# Patient Record
Sex: Female | Born: 1965 | Race: White | Hispanic: No | Marital: Married | Smoking: Former smoker
Health system: Southern US, Community
[De-identification: ages and names within clinical notes are randomized; demographics above are authoritative.]

## PROBLEM LIST (undated history)

## (undated) DIAGNOSIS — F959 Tic disorder, unspecified: Secondary | ICD-10-CM

## (undated) DIAGNOSIS — K219 Gastro-esophageal reflux disease without esophagitis: Secondary | ICD-10-CM

## (undated) DIAGNOSIS — J45909 Unspecified asthma, uncomplicated: Secondary | ICD-10-CM

## (undated) DIAGNOSIS — K589 Irritable bowel syndrome without diarrhea: Secondary | ICD-10-CM

---

## 2001-05-16 ENCOUNTER — Other Ambulatory Visit: Admission: RE | Admit: 2001-05-16 | Discharge: 2001-05-16 | Payer: Self-pay | Admitting: Family Medicine

## 2004-11-08 ENCOUNTER — Ambulatory Visit: Payer: Self-pay | Admitting: Family Medicine

## 2005-01-31 ENCOUNTER — Ambulatory Visit: Payer: Self-pay | Admitting: Family Medicine

## 2005-06-16 ENCOUNTER — Ambulatory Visit: Payer: Self-pay | Admitting: Family Medicine

## 2005-09-13 ENCOUNTER — Ambulatory Visit (HOSPITAL_COMMUNITY): Admission: RE | Admit: 2005-09-13 | Discharge: 2005-09-13 | Payer: Self-pay | Admitting: Neurosurgery

## 2010-09-19 ENCOUNTER — Encounter: Payer: Self-pay | Admitting: Neurosurgery

## 2012-07-24 ENCOUNTER — Emergency Department (HOSPITAL_COMMUNITY)
Admission: EM | Admit: 2012-07-24 | Discharge: 2012-07-24 | Disposition: A | Discharge status: Home / Self Care | Payer: BC Managed Care – PPO | Attending: Emergency Medicine | Admitting: Emergency Medicine

## 2012-07-24 ENCOUNTER — Telehealth: Payer: Self-pay | Admitting: Gastroenterology

## 2012-07-24 ENCOUNTER — Emergency Department (HOSPITAL_COMMUNITY): Payer: BC Managed Care – PPO

## 2012-07-24 ENCOUNTER — Encounter (HOSPITAL_COMMUNITY): Payer: Self-pay | Admitting: *Deleted

## 2012-07-24 DIAGNOSIS — Z79899 Other long term (current) drug therapy: Secondary | ICD-10-CM | POA: Insufficient documentation

## 2012-07-24 DIAGNOSIS — Z8719 Personal history of other diseases of the digestive system: Secondary | ICD-10-CM | POA: Insufficient documentation

## 2012-07-24 DIAGNOSIS — K219 Gastro-esophageal reflux disease without esophagitis: Secondary | ICD-10-CM | POA: Insufficient documentation

## 2012-07-24 DIAGNOSIS — R1013 Epigastric pain: Secondary | ICD-10-CM | POA: Insufficient documentation

## 2012-07-24 DIAGNOSIS — R0602 Shortness of breath: Secondary | ICD-10-CM | POA: Insufficient documentation

## 2012-07-24 HISTORY — DX: Irritable bowel syndrome, unspecified: K58.9

## 2012-07-24 HISTORY — DX: Gastro-esophageal reflux disease without esophagitis: K21.9

## 2012-07-24 LAB — COMPREHENSIVE METABOLIC PANEL
ALT: 28 U/L (ref 0–35)
AST: 19 U/L (ref 0–37)
Albumin: 3.4 g/dL — ABNORMAL LOW (ref 3.5–5.2)
Alkaline Phosphatase: 70 U/L (ref 39–117)
BUN: 15 mg/dL (ref 6–23)
Potassium: 4.3 mEq/L (ref 3.5–5.1)
Sodium: 137 mEq/L (ref 135–145)
Total Protein: 6.5 g/dL (ref 6.0–8.3)

## 2012-07-24 LAB — URINALYSIS, MICROSCOPIC ONLY
Bilirubin Urine: NEGATIVE
Nitrite: NEGATIVE
Specific Gravity, Urine: 1.029 (ref 1.005–1.030)
pH: 5.5 (ref 5.0–8.0)

## 2012-07-24 LAB — CBC WITH DIFFERENTIAL/PLATELET
Basophils Absolute: 0 10*3/uL (ref 0.0–0.1)
Basophils Relative: 0 % (ref 0–1)
Eosinophils Absolute: 0.3 10*3/uL (ref 0.0–0.7)
MCH: 31.3 pg (ref 26.0–34.0)
MCHC: 34.4 g/dL (ref 30.0–36.0)
Neutrophils Relative %: 75 % (ref 43–77)
Platelets: 232 10*3/uL (ref 150–400)
RDW: 12.5 % (ref 11.5–15.5)

## 2012-07-24 LAB — LIPASE, BLOOD: Lipase: 23 U/L (ref 11–59)

## 2012-07-24 LAB — POCT PREGNANCY, URINE: Preg Test, Ur: NEGATIVE

## 2012-07-24 MED ORDER — OXYCODONE-ACETAMINOPHEN 5-325 MG PO TABS: 1.0000 | ORAL_TABLET | ORAL | Status: DC | PRN

## 2012-07-24 MED ORDER — GI COCKTAIL ~~LOC~~
30.0000 mL | Freq: Once | ORAL | Status: AC
  Administered 2012-07-24: 30 mL via ORAL
  Filled 2012-07-24: qty 30

## 2012-07-24 MED ORDER — OXYCODONE-ACETAMINOPHEN 5-325 MG PO TABS
1.0000 | ORAL_TABLET | Freq: Once | ORAL | Status: AC
  Administered 2012-07-24: 1 via ORAL
  Filled 2012-07-24: qty 1

## 2012-07-24 MED ORDER — PANTOPRAZOLE SODIUM 40 MG IV SOLR
40.0000 mg | Freq: Once | INTRAVENOUS | Status: AC
  Administered 2012-07-24: 40 mg via INTRAVENOUS
  Filled 2012-07-24: qty 40

## 2012-07-24 MED ORDER — MORPHINE SULFATE 4 MG/ML IJ SOLN
4.0000 mg | Freq: Once | INTRAMUSCULAR | Status: AC
  Administered 2012-07-24: 4 mg via INTRAVENOUS
  Filled 2012-07-24: qty 1

## 2012-07-24 MED ORDER — SUCRALFATE 1 G PO TABS: 1.0000 g | ORAL_TABLET | Freq: Four times a day (QID) | ORAL | Status: DC

## 2012-07-24 MED ORDER — ONDANSETRON HCL 4 MG/2ML IJ SOLN
4.0000 mg | Freq: Once | INTRAMUSCULAR | Status: AC
  Administered 2012-07-24: 4 mg via INTRAVENOUS
  Filled 2012-07-24: qty 2

## 2012-07-24 NOTE — ED Provider Notes (Signed)
Medical screening examination/treatment/procedure(s) were performed by non-physician practitioner and as supervising physician I was immediately available for consultation/collaboration.  Cheri Guppy, MD 07/24/12 [phone]

## 2012-07-24 NOTE — ED Notes (Signed)
Report given to Robyn, RN.

## 2012-07-24 NOTE — ED Notes (Signed)
Pt has had n/v x3 days; pt states 4 episodes of emesis; pt also reports abdominal pain that has been present for some time and has progressively gotten worse; pt rates pain 7/10; pt is tender to palpate; pt also has a hx of IBS and reports loose stools today; pt seen at [facility] this am and treated for IBS and gastritis; abdominal pain still persists;

## 2012-07-24 NOTE — ED Notes (Signed)
Pt c/o generalized abdominal pain with n/v since Saturday.  Denies urinary sx

## 2012-07-24 NOTE — Telephone Encounter (Signed)
Pt has been to the ER as well as Urgent care she was told she has gastritis and IBS she was seen as a child by a GI but no history since.  Scheduled with Willette Cluster 07/25/12 930 am

## 2012-07-24 NOTE — ED Provider Notes (Signed)
History     CSN: [id number]  Arrival date & time 07/24/12  0203   First MD Initiated Contact with Patient 07/24/12 [phone]      Chief Complaint  Patient presents with  . Abdominal Pain    (Consider location/radiation/quality/duration/timing/severity/associated sxs/prior treatment) HPI History provided by pt.   Pt has chronic, intermittent, post-prandial epigastric pain for which she takes prevacid.  Pain acutely worsened 1.5 weeks ago and has gradually increased in severity since then.  Describes as non-exertional, non-positional, burning w/ radiation into chest and associated N/V.  Has not had fever, cough, worse than baseline diarrhea, hematemesis/hematochezia/melena, GU sx.  Has had exertional SOB for the past 6 weeks.  Was seen by an urgent care for this problem yesterday and she was discharged home w/ recommendation to double up on her prevacid and f/u with GI.  Woke w/ intolerable pain this morning.   Past Medical History  Diagnosis Date  . GERD (gastroesophageal reflux disease)   . IBS (irritable bowel syndrome)     Past Surgical History  Procedure Date  . Cesarean section     History reviewed. No pertinent family history.  History  Substance Use Topics  . Smoking status: Never Smoker   . Smokeless tobacco: Not on file  . Alcohol Use: No    OB History    Grav Para Term Preterm Abortions TAB SAB Ect Mult Living                  Review of Systems  All other systems reviewed and are negative.    Allergies  Review of patient's allergies indicates no known allergies.  Home Medications   Current Outpatient Rx  Name  Route  Sig  Dispense  Refill  . EMETROL 1.87-1.87-21.5 PO SOLN   Oral   Take 10 mLs by mouth every 15 (fifteen) minutes as needed. For nausea         . LANSOPRAZOLE 30 MG PO CPDR   Oral   Take 30 mg by mouth daily.           BP 124/79  Pulse 91  Temp 97.7 F (36.5 C) (Oral)  Resp 19  SpO2 98%  Physical Exam  Nursing note and  vitals reviewed. Constitutional: She is oriented to person, place, and time. She appears well-developed and well-nourished. No distress.  HENT:  Head: Normocephalic and atraumatic.  Eyes:       Normal appearance  Neck: Normal range of motion.  Cardiovascular: Normal rate and regular rhythm.   Pulmonary/Chest: Effort normal and breath sounds normal. No respiratory distress.       Mid-line chest tenderness  Abdominal: Soft. Bowel sounds are normal. She exhibits no distension and no mass. There is no rebound and no guarding.       Epigastric and RUQ tto  Genitourinary:       No CVA tenderness  Musculoskeletal: Normal range of motion.  Neurological: She is alert and oriented to person, place, and time.  Skin: Skin is warm and dry. No rash noted.  Psychiatric: She has a normal mood and affect. Her behavior is normal.    ED Course  Procedures (including critical care time)   Date: 07/24/2012  Rate: 88  Rhythm: normal sinus rhythm  QRS Axis: normal  Intervals: normal  ST/T Wave abnormalities: normal  Conduction Disutrbances:none  Narrative Interpretation:   Old EKG Reviewed: none available   Labs Reviewed  COMPREHENSIVE METABOLIC PANEL - Abnormal; Notable for the following:  Glucose, Bld 134 (*)     Albumin 3.4 (*)     Total Bilirubin 0.2 (*)     GFR calc non Af Amer 81 (*)     All other components within normal limits  URINALYSIS, MICROSCOPIC ONLY - Abnormal; Notable for the following:    APPearance CLOUDY (*)     Leukocytes, UA SMALL (*)     Bacteria, UA MANY (*)     Squamous Epithelial / LPF MANY (*)     All other components within normal limits  CBC WITH DIFFERENTIAL  LIPASE, BLOOD  POCT PREGNANCY, URINE  URINE CULTURE  URINALYSIS, ROUTINE W REFLEX MICROSCOPIC   Dg Chest 2 View  07/24/2012  *RADIOLOGY REPORT*  Clinical Data: Abdominal pain and shortness of breath.  CHEST - 2 VIEW  Comparison: None.  Findings: The lungs are well-aerated and clear.  There is no  evidence of focal opacification, pleural effusion or pneumothorax.  The heart is normal in size; the mediastinal contour is within normal limits.  No acute osseous abnormalities are seen.  IMPRESSION: No acute cardiopulmonary process seen.   Original Report Authenticated By: Tonia Ghent, M.D.    US Abdomen Complete  07/24/2012  *RADIOLOGY REPORT*  Clinical Data:  Epigastric abdominal pain.  ABDOMINAL ULTRASOUND COMPLETE  Comparison:  None  Findings:  Gallbladder:  The gallbladder is normal in appearance, without evidence for gallstones, gallbladder wall thickening or pericholecystic fluid.  No ultrasonographic Murphy's sign is elicited.  Common Bile Duct:  0.3 cm in diameter; within normal limits in caliber.  Liver:  Normal parenchymal echogenicity and echotexture; scattered small foci of increased echogenicity within the left hepatic lobe, measuring up to 2.4 cm in size, most likely reflect hemangiomas, though correlation with laboratory values is suggested.  Limited Doppler evaluation demonstrates normal blood flow within the liver.  IVC:  Unremarkable in appearance.  Pancreas:  Although the pancreas is difficult to visualize in its entirety due to overlying bowel gas, no focal pancreatic abnormality is identified.  Spleen:  8.7 cm in length; within normal limits in size and echotexture.  Right kidney:  11.2 cm in length; normal in size, configuration and parenchymal echogenicity.  No evidence of mass or hydronephrosis.  Left kidney:  10.4 cm in length; normal in size, configuration and parenchymal echogenicity.  No evidence of mass or hydronephrosis.  Abdominal Aorta:  Normal in caliber; no aneurysm identified.  Evaluation is mildly suboptimal due to overlying bowel gas.  IMPRESSION:  1.  Gallbladder unremarkable in appearance. 2.  Scattered foci of increased echogenicity within the left hepatic lobe, measuring up to 2.4 cm in size, most likely reflect hemangiomas, though correlation with LFTs is suggested  as deemed clinically appropriate.   Original Report Authenticated By: Tonia Ghent, M.D.      1. Epigastric pain       MDM  [phone] F w/ GERD and IBS, otherwise healthy, presents w/ acute on chronic, severe epigastric pain + N/V.  ROS revealed 6wks exertional SOB as well.  TIMI 0.  Labs unremarkable.  EKG non-ischemic and CXR neg.  [place] abd ordered to r/o cholelithiasis, particularly because of duration of sx, colicky nature and lack of improvement w/ PPI.  Pt to receive IV NS, protonix and morphine as well as GI cocktail.    Pt reports that her pain and nausea have resolved.  US unremarkable.   Results discussed w/ pt.   Sx likely d/t gastritis.  D/c'd home w/ percocet and carafate and recommended that  she continue her prevacid.  She has a prescription for phenergan.  Referred to gastroenterology as well as cardiology.  Return precautions discussed. 5:24 AM         Otilio Miu, PA 07/24/12 [id number]  Otilio Miu, PA 07/24/12 [phone]

## 2012-07-25 ENCOUNTER — Encounter: Payer: Self-pay | Admitting: Physician Assistant

## 2012-07-25 ENCOUNTER — Ambulatory Visit (INDEPENDENT_AMBULATORY_CARE_PROVIDER_SITE_OTHER)
Admission: RE | Admit: 2012-07-25 | Discharge: 2012-07-25 | Disposition: A | Discharge status: Home / Self Care | Payer: BC Managed Care – PPO | Source: Ambulatory Visit | Attending: Physician Assistant | Admitting: Physician Assistant

## 2012-07-25 ENCOUNTER — Telehealth: Payer: Self-pay | Admitting: *Deleted

## 2012-07-25 ENCOUNTER — Ambulatory Visit (INDEPENDENT_AMBULATORY_CARE_PROVIDER_SITE_OTHER): Payer: BC Managed Care – PPO | Admitting: Physician Assistant

## 2012-07-25 VITALS — BP 124/82 | HR 109 | Ht 67.0 in | Wt 199.6 lb

## 2012-07-25 DIAGNOSIS — R1033 Periumbilical pain: Secondary | ICD-10-CM

## 2012-07-25 DIAGNOSIS — R52 Pain, unspecified: Secondary | ICD-10-CM

## 2012-07-25 DIAGNOSIS — R112 Nausea with vomiting, unspecified: Secondary | ICD-10-CM

## 2012-07-25 LAB — URINE CULTURE: Colony Count: 9000

## 2012-07-25 MED ORDER — DICYCLOMINE HCL 10 MG PO CAPS: ORAL_CAPSULE | ORAL | Status: DC

## 2012-07-25 MED ORDER — IOHEXOL 300 MG/ML  SOLN
100.0000 mL | Freq: Once | INTRAMUSCULAR | Status: AC | PRN
  Administered 2012-07-25: 100 mL via INTRAVENOUS

## 2012-07-25 NOTE — Patient Instructions (Addendum)
We sent a prescription for Bentyl ( Dicyclomine) to [facility].   You have been scheduled for a CT scan of the abdomen and pelvis at [facility] ([address]---this is in the same building as Architectural technologist).   You are scheduled today at 2:30 PM . You should arrive at 2:15 PM  to your appointment time for registration. Please follow the written instructions below on the day of your exam:  WARNING: IF YOU ARE ALLERGIC TO IODINE/X-RAY DYE, PLEASE NOTIFY RADIOLOGY IMMEDIATELY AT [phone]! YOU WILL BE GIVEN A 13 HOUR PREMEDICATION PREP.  1) Do not eat or drink anything after 10:30 am  (4 hours prior to your test) 2) You have been given 2 bottles of oral contrast to drink. The solution may taste better if refrigerated, but do NOT add ice or any other liquid to this solution. Shake  well before drinking.    Drink 1 bottle of contrast @ 12:30 PM  (2 hours prior to your exam)  Drink 1 bottle of contrast @ 1:30 PM  (1 hour prior to your exam)  You may take any medications as prescribed with a small amount of water except for the following: Metformin, Glucophage, Glucovance, Avandamet, Riomet, Fortamet, Actoplus Met, Janumet, Glumetza or Metaglip. The above medications must be held the day of the exam AND 48 hours after the exam.  The purpose of you drinking the oral contrast is to aid in the visualization of your intestinal tract. The contrast solution may cause some diarrhea. Before your exam is started, you will be given a small amount of fluid to drink. Depending on your individual set of symptoms, you may also receive an intravenous injection of x-ray contrast/dye. Plan on being at [facility] for 30 minutes or long, depending on the type of exam you are having performed.  If you have any questions regarding your exam or if you need to reschedule, you may call the CT department at [phone] between the hours of 8:00 am and 5:00 pm,  Monday-Friday.  ________________________________________________________________________

## 2012-07-25 NOTE — Telephone Encounter (Signed)
Called and informed the patient's husband that Amy said we won't have results until Monday AM for them due to the holiday long weekend.  We are not here the next 2 days, Thursday or Friday.  I told him to advise his wife to take the Bentyl at the first signs of cramping.  I also said Amy wasn't real concerned . She ordered the CT to rule out IBD.  He thanked me for calling.

## 2012-07-25 NOTE — Progress Notes (Signed)
Agree with initial assessment and plans. Amy, please followup on the CT scan and have the patient followup with you in a few weeks. Depending upon the findings and response to therapies, we can decide on further evaluation such as endoscopic procedures.

## 2012-07-25 NOTE — Progress Notes (Signed)
Subjective:    Patient ID: [name], female    DOB: 01/23/1966, 46 y.o.   MRN: [id number]  HPI Alvis Lemmings is a pleasant 46 year old white female, new to GI today referred by the ER. She had presented to the emergency room on 07/24/2012 with complaints of epigastric pain which had become unbearable. She had labs done with negative urine pregnancy test negative urine culture, normal CBC, and normal C. met with the exception of an albumin at 3.4. She had upper abdominal ultrasound done which was negative with the exception of some small foci of increased echogenicity within the left lobe of the liver measuring up to 2.4 cm in size most likely reflecting hemangiomas. Patient relates late long history of chronic GERD and has been on PPI therapy on a daily basis for many years. She states that she stopped her medicine she will have recurrent symptoms fairly quickly. She has no complaints of dysphagia or heartburn. She says she has history of probable IBS and has had periodic episodes of abdominal discomfort and loose stools but over the past 2 months has had 3 distinct episodes of mid abdominal pain cramping nausea intermittent vomiting and mild diarrhea. She says usually these episodes in the past of only lasted a day or 2 and these past 3 episodes have been more prolonged and the last one especially more intense. Her last episode started on Saturday 1123 and says she had sharp constant ongoing pain for about 4 days. She did have chills associated with this and some sweats but no documented fever. She says her bowel movements are usually loose but just one bowel movement a day, she had some increase in diarrhea past couple of days which has since resolved. She does not use any regular aspirin or NSAIDs., No recent antibiotics. She has weaned herself off of Lexapro Wellbutrin over a 2 month. Says the symptoms were all ready occurring when she had done that. Her appetite is good between these episodes and she's not lost  any weight    Review of Systems  Constitutional: Positive for appetite change.  HENT: Negative.   Eyes: Negative.   Respiratory: Negative.   Cardiovascular: Negative.   Gastrointestinal: Positive for nausea and abdominal pain.  Genitourinary: Negative.   Neurological: Negative.   Hematological: Negative.   Psychiatric/Behavioral: Negative.    Outpatient Prescriptions Prior to Visit  Medication Sig Dispense Refill  . anti-nausea (EMETROL) solution Take 10 mLs by mouth every 15 (fifteen) minutes as needed. For nausea      . lansoprazole (PREVACID) 30 MG capsule Take 30 mg by mouth daily.      Marland Kitchen oxyCODONE-acetaminophen (PERCOCET/ROXICET) 5-325 MG per tablet Take 1 tablet by mouth every 4 (four) hours as needed for pain.  20 tablet  0  . sucralfate (CARAFATE) 1 G tablet Take 1 tablet (1 g total) by mouth 4 (four) times daily.  30 tablet  0  . gi cocktail (Maalox,Lidocaine,Donnatal)       . morphine 4 MG/ML injection 4 mg       . ondansetron (ZOFRAN) injection 4 mg       . oxyCODONE-acetaminophen (PERCOCET/ROXICET) 5-325 MG per tablet 1 tablet       . pantoprazole (PROTONIX) injection 40 mg        There is no problem list on file for this patient.  History  Substance Use Topics  . Smoking status: Never Smoker   . Smokeless tobacco: Never Used  . Alcohol Use: No   Family History  Problem Relation Age of Onset  . Prostate cancer Father   . Diabetes Mother   . Diabetes Father   . Stomach cancer Maternal Grandmother   . Liver cancer Paternal Grandfather   . Heart disease Father       Objective:   Physical Exam well-developed white female in no acute distress, company by her husband. Blood pressure 124/82 pulse 109 height 5 foot 7 weight 199. HEENT; nontraumatic normocephalic EOMI PERRLA sclera anicteric,Neck; Supple no JVD, Cardiovascular; regular rate and rhythm with S1-S2 no murmur or gallop, Pulmonary; clear bilaterally, Abdomen; soft bowel sounds are active there is no  palpable mass or hepatosplenomegaly, she is very tender in the right mid quadrant and.periumbilically,there is no guarding or rebound   Rectal; exam not done, Extremities; no clubbing cyanosis or edema skin warm and dry, Psych; mood and affect normal and appropriate.        Assessment & Plan:  #54 46 year old female with chronic GERD #2  3 episodes of mid abdominal pain and cramping associated with nausea occasional vomiting and loose stool occurring over the past 2 months. Last episode more prolonged and intense. No evidence for biliary colic by recent ultrasound . 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

## 2012-10-13 ENCOUNTER — Other Ambulatory Visit: Payer: Self-pay

## 2013-07-04 ENCOUNTER — Other Ambulatory Visit: Payer: Self-pay

## 2015-10-22 ENCOUNTER — Institutional Professional Consult (permissible substitution): Payer: Self-pay | Admitting: Pulmonary Disease

## 2015-11-11 ENCOUNTER — Encounter: Payer: Self-pay | Admitting: Pulmonary Disease

## 2015-11-11 ENCOUNTER — Ambulatory Visit (INDEPENDENT_AMBULATORY_CARE_PROVIDER_SITE_OTHER): Payer: Managed Care, Other (non HMO) | Admitting: Pulmonary Disease

## 2015-11-11 VITALS — BP 124/66 | HR 88 | Ht 67.0 in | Wt 227.2 lb

## 2015-11-11 DIAGNOSIS — J4551 Severe persistent asthma with (acute) exacerbation: Secondary | ICD-10-CM | POA: Diagnosis not present

## 2015-11-11 MED ORDER — FLUTICASONE FUROATE 100 MCG/ACT IN AEPB
1.0000 | INHALATION_SPRAY | Freq: Every day | RESPIRATORY_TRACT | Status: DC
Start: 2015-11-11 — End: 2017-01-04

## 2015-11-11 NOTE — Patient Instructions (Signed)
We restart you on the Arnuity Elipta. Please use it once a day. Continue using the albuterol rescue inhaler as needed. I'll schedule you for pulmonary function tests and a sleep study.

## 2015-11-11 NOTE — Progress Notes (Signed)
Subjective:    Patient ID: Elizabeth Krueger, female    DOB: 10/05/1965, 50 y.o.   MRN: [id number]  HPI Consult for management of asthma.  Mrs. Elizabeth Krueger is a 50 year old with past medical history of asthma, hypertension, allergy. She's been diagnosed with asthma in 2014. She reports feeling unwell for the past few months. Symptoms include dyspnea, wheezing, chest congestion, cough. There is no sputum production, fevers, chills. She had been treated with doxycycline and prednisone by her primary care physician. She was put on Arnuity Elipta for a week last month. She reports improvement in her symptoms at that time. She is no longer on it and is just using the albuterol rescue inhaler.  She has seasonal allergies and is sensitive to pollen and dust. She also has acid reflux and is on a PPI. She reports daily snoring, daytime fatigue and witnessed apneas.  Social History: Minimal remote smoking history. She smoked 1 pack per week for a couple of years. She quit around 2000. Occasional alcohol use no drug use. 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 There are no exposures at work or at home.  Family History: Father-asthma, COPD, heart disease, rheumatism.   Past Medical History  Diagnosis Date  . GERD (gastroesophageal reflux disease)   . IBS (irritable bowel syndrome)     Current outpatient prescriptions:  .  albuterol (PROAIR HFA) 108 (90 Base) MCG/ACT inhaler, Inhale 2 puffs into the lungs every 6 (six) hours as needed for wheezing or shortness of breath., Disp: , Rfl:  .  albuterol (PROVENTIL) (2.5 MG/3ML) 0.083% nebulizer solution, Take 2.5 mg by nebulization every 6 (six) hours as needed for wheezing or shortness of breath., Disp: , Rfl:  .  ALPRAZolam (XANAX) 0.5 MG tablet, Take 0.5 mg by mouth at bedtime as needed., Disp: , Rfl:  .  dicyclomine (BENTYL) 10 MG capsule, Take 1 tab 2-3 times daily as needed for cramping and spasms., Disp: 90 capsule, Rfl: 0 .   escitalopram (LEXAPRO) 10 MG tablet, Take 10 mg by mouth daily., Disp: , Rfl:  .  hydrochlorothiazide (HYDRODIURIL) 25 MG tablet, Take 25 mg by mouth daily., Disp: , Rfl:  .  lansoprazole (PREVACID) 30 MG capsule, Take 30 mg by mouth daily., Disp: , Rfl:  .  metoprolol succinate (TOPROL-XL) 100 MG 24 hr tablet, Take 100 mg by mouth daily. Take with or immediately following a meal., Disp: , Rfl:  .  zolpidem (AMBIEN) 5 MG tablet, Take 2.5 mg by mouth at bedtime as needed for sleep., Disp: , Rfl:   Review of Systems Cough, dyspnea, wheezing, congestion. No sputum production, fevers, chills. No chest pain, palpitation. No nausea, vomiting, diarrhea, constipation. No fevers, chills. All other review of systems are negative    Objective:   Physical Exam Blood pressure 124/66, pulse 88, height 5\' 7"  (1.702 m), weight 227 lb 3.2 oz (103.057 kg), SpO2 97 %. Gen: No apparent distress Neuro: No gross focal deficits. Neck: No JVD, lymphadenopathy, thyromegaly. RS: B/L wheeze. No crackles CVS: S1-S2 heard, no murmurs rubs gallops. Abdomen: Soft, positive bowel sounds. Extremities: No edema.    Assessment & Plan:  #1 Asthmatic bronchitis She has severe persistent asthma with with daily symptoms. She improved previously on inhaled glucocorticoid. I will resume this and it and she'll continue the albuterol rescue inhaler. I'll reassess her symptoms in a month and decide if she needs to be on a LABA as well. She'll get scheduled for pulmonary function tests. I  will get CBC with diff and IgE level at next visit.  #2 Apnea She'll get scheduled for a split-night sleep study to evaluate for OSA.  Plan: - PFTs - Resume Arnuity Elipta - Continue albuterol rescue inhaler - Sleep study  Chilton Greathouse MD [facility] Pager [phone] If no answer or after 3pm call: [phone] 11/11/2015, 11:10 AM

## 2015-11-13 NOTE — Addendum Note (Signed)
Addended by: Boone MasterJONES, JESSICA E on: 11/13/2015 04:17 PM   Modules accepted: Orders, Medications

## 2015-11-16 ENCOUNTER — Telehealth: Payer: Self-pay | Admitting: *Deleted

## 2015-11-16 NOTE — Telephone Encounter (Signed)
Submitted PA for Arnuity thru [facility]. Key: Geoffery [id number] Submitted for review  Lamonte SakaiCigna ID:[id number]  [facility] [phone] (Phone) [phone] (Fax)  Will await response

## 2015-11-17 NOTE — Telephone Encounter (Signed)
PA is still in process 

## 2015-11-19 NOTE — Telephone Encounter (Signed)
Arnuity has been denied. Public affairs consultantnsurance states Qvar or Pulmicort Flexihaler.

## 2015-11-20 NOTE — Telephone Encounter (Signed)
LMTCB

## 2015-11-20 NOTE — Telephone Encounter (Signed)
Change arnuity prescription to Qvar 160 mcg twice daily

## 2015-11-26 MED ORDER — BECLOMETHASONE DIPROPIONATE 80 MCG/ACT IN AERS: 2.0000 | INHALATION_SPRAY | Freq: Two times a day (BID) | RESPIRATORY_TRACT | Status: DC

## 2015-11-26 NOTE — Telephone Encounter (Signed)
lmtcb x2 for pt. 

## 2015-11-26 NOTE — Telephone Encounter (Signed)
Pt returned call. Informed her of PM's recs and verified pharmacy as [facility] [place]. She voiced understanding and had no further questions. Rx sent. Nothing further needed.

## 2015-12-07 ENCOUNTER — Institutional Professional Consult (permissible substitution): Payer: Self-pay | Admitting: Pulmonary Disease

## 2015-12-09 ENCOUNTER — Institutional Professional Consult (permissible substitution): Payer: Self-pay | Admitting: Pulmonary Disease

## 2015-12-15 ENCOUNTER — Telehealth: Payer: Self-pay | Admitting: Pulmonary Disease

## 2015-12-15 NOTE — Telephone Encounter (Signed)
Spoke with Almyra FreeLibby about this as she takes care of precert's on these items. Almyra FreeLibby has information for patient and aware that phone message is being sent to her.

## 2015-12-15 NOTE — Telephone Encounter (Signed)
Pt's [employer] does required a precert and forms have been filled out and faxed to [employer] Sally E Ottinger

## 2015-12-18 ENCOUNTER — Encounter: Payer: Self-pay | Admitting: Pulmonary Disease

## 2015-12-18 ENCOUNTER — Other Ambulatory Visit (INDEPENDENT_AMBULATORY_CARE_PROVIDER_SITE_OTHER): Payer: Managed Care, Other (non HMO)

## 2015-12-18 ENCOUNTER — Ambulatory Visit (HOSPITAL_COMMUNITY)
Admission: RE | Admit: 2015-12-18 | Discharge: 2015-12-18 | Disposition: A | Discharge status: Home / Self Care | Payer: Managed Care, Other (non HMO) | Source: Ambulatory Visit | Attending: Pulmonary Disease | Admitting: Pulmonary Disease

## 2015-12-18 ENCOUNTER — Ambulatory Visit (INDEPENDENT_AMBULATORY_CARE_PROVIDER_SITE_OTHER): Payer: Managed Care, Other (non HMO) | Admitting: Pulmonary Disease

## 2015-12-18 VITALS — BP 118/84 | HR 82 | Ht 67.0 in | Wt 232.4 lb

## 2015-12-18 DIAGNOSIS — J4551 Severe persistent asthma with (acute) exacerbation: Secondary | ICD-10-CM | POA: Insufficient documentation

## 2015-12-18 DIAGNOSIS — J4550 Severe persistent asthma, uncomplicated: Secondary | ICD-10-CM | POA: Diagnosis not present

## 2015-12-18 LAB — PULMONARY FUNCTION TEST
DL/VA % PRED: 84 %
DL/VA: 4.33 ml/min/mmHg/L
DLCO UNC: 19.95 ml/min/mmHg
DLCO unc % pred: 70 %
FEF 25-75 POST: 3.05 L/s
FEF 25-75 Pre: 2.32 L/sec
FEF2575-%Change-Post: 31 %
FEF2575-%PRED-POST: 103 %
FEF2575-%PRED-PRE: 78 %
FEV1-%CHANGE-POST: 6 %
FEV1-%Pred-Post: 86 %
FEV1-%Pred-Pre: 81 %
FEV1-Post: 2.69 L
FEV1-Pre: 2.53 L
FEV1FVC-%Change-Post: 3 %
FEV1FVC-%PRED-PRE: 98 %
FEV6-%Change-Post: 3 %
FEV6-%Pred-Post: 86 %
FEV6-%Pred-Pre: 83 %
FEV6-POST: 3.29 L
FEV6-Pre: 3.18 L
FEV6FVC-%CHANGE-POST: 0 %
FEV6FVC-%PRED-POST: 102 %
FEV6FVC-%Pred-Pre: 102 %
FVC-%Change-Post: 3 %
FVC-%PRED-POST: 84 %
FVC-%PRED-PRE: 81 %
FVC-POST: 3.31 L
FVC-PRE: 3.2 L
POST FEV1/FVC RATIO: 81 %
PRE FEV1/FVC RATIO: 79 %
Post FEV6/FVC ratio: 99 %
Pre FEV6/FVC Ratio: 99 %
RV % pred: 86 %
RV: 1.66 L
TLC % PRED: 87 %
TLC: 4.83 L

## 2015-12-18 LAB — CBC WITH DIFFERENTIAL/PLATELET
BASOS ABS: 0 10*3/uL (ref 0.0–0.1)
Basophils Relative: 0.4 % (ref 0.0–3.0)
EOS PCT: 6.2 % — AB (ref 0.0–5.0)
Eosinophils Absolute: 0.4 10*3/uL (ref 0.0–0.7)
HEMATOCRIT: 40.9 % (ref 36.0–46.0)
Hemoglobin: 13.7 g/dL (ref 12.0–15.0)
LYMPHS ABS: 2.7 10*3/uL (ref 0.7–4.0)
LYMPHS PCT: 38 % (ref 12.0–46.0)
MCHC: 33.5 g/dL (ref 30.0–36.0)
MCV: 93.2 fl (ref 78.0–100.0)
MONOS PCT: 5.4 % (ref 3.0–12.0)
Monocytes Absolute: 0.4 10*3/uL (ref 0.1–1.0)
NEUTROS ABS: 3.6 10*3/uL (ref 1.4–7.7)
Neutrophils Relative %: 50 % (ref 43.0–77.0)
Platelets: 244 10*3/uL (ref 150.0–400.0)
RBC: 4.39 Mil/uL (ref 3.87–5.11)
RDW: 13.1 % (ref 11.5–15.5)
WBC: 7.1 10*3/uL (ref 4.0–10.5)

## 2015-12-18 MED ORDER — ALBUTEROL SULFATE (2.5 MG/3ML) 0.083% IN NEBU
2.5000 mg | INHALATION_SOLUTION | Freq: Once | RESPIRATORY_TRACT | Status: AC
  Administered 2015-12-18: 2.5 mg via RESPIRATORY_TRACT

## 2015-12-18 NOTE — Patient Instructions (Addendum)
Continue using the arnuity samples that you have. When this is over we'll switch over to Qvar as per your insurance company. We will send for CBC with diff and IgE levels today.  Return to clinic in 6 months.

## 2015-12-18 NOTE — Progress Notes (Signed)
Subjective:    Patient ID: Elizabeth Krueger, female    DOB: 11-Nov-1965, 50 y.o.   MRN: [id number]  PROBLEM LIST: Severe persistent asthma  HPI Elizabeth Krueger is a 50 year old with past medical history of asthma, hypertension, allergy. She's been diagnosed with asthma in 2014. She reports feeling unwell for the past few months. Symptoms include dyspnea, wheezing, chest congestion, cough. There is no sputum production, fevers, chills. She had been treated with doxycycline and prednisone by her primary care physician. She has seasonal allergies and is sensitive to pollen and dust. She also has acid reflux and is on a PPI. She reports daily snoring, daytime fatigue and witnessed apneas.  She was started on arnuity at the last visit and is doing very well on it. She hardly ever uses to use the albuterol inhaler.   DATA: PFTs 12/18/15 FVC 2.20 (81%] FEV1 2.53 (81%] F/F 79 TLC 87% DLCO 70% Minimal obstructive disease as shown by scooping out off her expiratory limb. Mild diffusion defect which corrects for alveolar volume   Social History: Minimal remote smoking history. She smoked 1 pack per week for a couple of years. She quit around 2000. Occasional alcohol use no drug use. 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 There are no exposures at work or at home.  Family History: Father-asthma, COPD, heart disease, rheumatism.   Past Medical History  Diagnosis Date  . GERD (gastroesophageal reflux disease)   . IBS (irritable bowel syndrome)     Current outpatient prescriptions:  .  albuterol (PROAIR HFA) 108 (90 Base) MCG/ACT inhaler, Inhale 2 puffs into the lungs every 6 (six) hours as needed for wheezing or shortness of breath., Disp: , Rfl:  .  albuterol (PROVENTIL) (2.5 MG/3ML) 0.083% nebulizer solution, Take 2.5 mg by nebulization every 6 (six) hours as needed for wheezing or shortness of breath., Disp: , Rfl:  .  citalopram (CELEXA) 20 MG tablet, Take 20 mg by mouth  daily., Disp: , Rfl:  .  Fluticasone Furoate (ARNUITY ELLIPTA) 100 MCG/ACT AEPB, Inhale 1 puff into the lungs daily., Disp: 30 each, Rfl: 3 .  hydrochlorothiazide (HYDRODIURIL) 25 MG tablet, Take 25 mg by mouth daily., Disp: , Rfl:  .  lansoprazole (PREVACID) 30 MG capsule, Take 30 mg by mouth daily., Disp: , Rfl:  .  metoprolol succinate (TOPROL-XL) 100 MG 24 hr tablet, Take 100 mg by mouth daily. Take with or immediately following a meal., Disp: , Rfl:  .  zolpidem (AMBIEN) 5 MG tablet, Take 2.5 mg by mouth at bedtime as needed for sleep., Disp: , Rfl:  .  ALPRAZolam (XANAX) 0.5 MG tablet, Take 0.5 mg by mouth at bedtime as needed. Reported on 12/18/2015, Disp: , Rfl:  .  beclomethasone (QVAR) 80 MCG/ACT inhaler, Inhale 2 puffs into the lungs 2 (two) times daily. (Patient not taking: Reported on 12/18/2015), Disp: 1 Inhaler, Rfl: 6  Review of Systems No Cough, dyspnea, wheezing, congestion. No sputum production, fevers, chills. No chest pain, palpitation. No nausea, vomiting, diarrhea, constipation. No fevers, chills. All other review of systems are negative    Objective:   Physical Exam Blood pressure 118/84, pulse 82, height  (1.702 m), weight 232 lb 6.4 oz (105.416 kg), SpO2 95 %. Gen: No apparent distress Neuro: No gross focal deficits. Neck: No JVD, lymphadenopathy, thyromegaly. RS: B/L wheeze. No crackles CVS: S1-S2 heard, no murmurs rubs gallops. Abdomen: Soft, positive bowel sounds. Extremities: No edema.    Assessment & Plan:  #  1 Asthmatic bronchitis Her symptoms have improved considerably on arnuity, She hardly needs to use the albuterol inhaler at all. She still has some samples of these left and when they are over she will switch to Qvar as per her insurance coverage. She will call us if her symptom control gets worse with this change.  I will get CBC with diff and IgE level today as a baseline assessment.  #2 Apnea She is scheduled for a split-night sleep study to  evaluate for OSA in a few weeks  Plan: - Switch arnuity to qvar. - Continue albuterol rescue inhaler - Sleep study - CBC with diff, IgE levels.  Chilton GreathousePraveen Jashay Roddy MD [facility] Pager [phone] If no answer or after 3pm call: [phone] 12/18/2015, 12:00 PM

## 2015-12-21 LAB — IGE: IgE (Immunoglobulin E), Serum: 206 kU/L — ABNORMAL HIGH (ref <115)

## 2015-12-24 ENCOUNTER — Telehealth: Payer: Self-pay | Admitting: Pulmonary Disease

## 2015-12-24 NOTE — Telephone Encounter (Signed)
Please let the patient know that the blood tests show elevation in eosinophils and IgE. This is consistent with asthma. I do not recommend any change to her current therapy Spoke with pt and notified of results per Dr. Jarold MottoManamm. Pt verbalized understanding and denied any questions.

## 2015-12-24 NOTE — Progress Notes (Signed)
Quick Note:  Spoke with pt and notified of results per Dr. Manamm. Pt verbalized understanding and denied any questions.  ______ 

## 2015-12-25 ENCOUNTER — Telehealth: Payer: Self-pay | Admitting: *Deleted

## 2015-12-25 DIAGNOSIS — G4733 Obstructive sleep apnea (adult) (pediatric): Secondary | ICD-10-CM

## 2015-12-25 NOTE — Telephone Encounter (Signed)
HST okayed per PM per Almyra FreeLibby Order placed Will sign off

## 2015-12-25 NOTE — Telephone Encounter (Signed)
-----   Message from Tobe SosSally E Ottinger sent at 12/22/2015  2:18 PM EDT ----- [employer] denied this split night study i need an order for a home study thanks libby

## 2016-01-01 ENCOUNTER — Encounter (HOSPITAL_BASED_OUTPATIENT_CLINIC_OR_DEPARTMENT_OTHER): Payer: Managed Care, Other (non HMO)

## 2016-01-13 ENCOUNTER — Telehealth: Payer: Self-pay | Admitting: Pulmonary Disease

## 2016-01-13 NOTE — Telephone Encounter (Signed)
Called spoke with pt. Pt states that she has a sinus infection at this time. She is scheduled for the HST tomorrow but feels that it will be extremely uncomfortable due to the infection. She is requesting that we cancel tomorrow's HST and reschedule. I explained to her that [facility] would [facility] and that she would receive a call to reschedule. She voiced understanding and had no further questions.

## 2016-01-14 NOTE — Telephone Encounter (Signed)
I spoke to pt & rescheduled her HST.  Nothing further needed.

## 2016-01-29 DIAGNOSIS — G4733 Obstructive sleep apnea (adult) (pediatric): Secondary | ICD-10-CM | POA: Diagnosis not present

## 2016-02-04 DIAGNOSIS — G4733 Obstructive sleep apnea (adult) (pediatric): Secondary | ICD-10-CM | POA: Diagnosis not present

## 2016-02-05 ENCOUNTER — Telehealth: Payer: Self-pay | Admitting: Pulmonary Disease

## 2016-02-05 ENCOUNTER — Other Ambulatory Visit: Payer: Self-pay | Admitting: *Deleted

## 2016-02-05 DIAGNOSIS — G4733 Obstructive sleep apnea (adult) (pediatric): Secondary | ICD-10-CM

## 2016-02-05 NOTE — Telephone Encounter (Signed)
Attempted to call. Line was busy. Will try back.

## 2016-02-08 NOTE — Telephone Encounter (Signed)
[phone], pt cb

## 2016-02-09 NOTE — Telephone Encounter (Addendum)
Spoke with the pt  She states HST done 01/29/16  She is requesting results  Please advise, thanks!

## 2016-02-09 NOTE — Telephone Encounter (Signed)
Pt returning call for results.Elizabeth Krueger ° °

## 2016-02-09 NOTE — Telephone Encounter (Signed)
LMTCB

## 2016-02-09 NOTE — Telephone Encounter (Signed)
[phone], pt cb

## 2016-02-09 NOTE — Progress Notes (Signed)
Quick Note:  LMOM TCB x1. ______ 

## 2016-02-09 NOTE — Telephone Encounter (Signed)
Please let the patient know that the sleep studies confirms sleep apnea. Please order auto CPAP 5-15 with mask fitting.

## 2016-02-09 NOTE — Telephone Encounter (Signed)
Spoke with patient-she is aware that she has OSA and needs CPAP therapy; order placed and aware that our PCC's will contact with name of company. Also, will send message to JJ to work out OV when needed for follow up CPAP set up.

## 2016-02-10 NOTE — Telephone Encounter (Signed)
Dr Isaiah SergeMannam, please advise when you would like pt to follow up for CPAP.  Thank you.

## 2016-02-11 NOTE — Telephone Encounter (Signed)
Spoke with the pt and notified per PM needs 3 month f/u  Appt scheduled for 05/13/16

## 2016-02-11 NOTE — Telephone Encounter (Signed)
Follow up in 3 months with CPAP download.

## 2016-02-12 ENCOUNTER — Telehealth: Payer: Self-pay | Admitting: Pulmonary Disease

## 2016-02-12 NOTE — Telephone Encounter (Signed)
Ordered cancelled with [facility] and [facility] Ottinger

## 2016-02-12 NOTE — Telephone Encounter (Signed)
Order was placed and patient was set up with [facility] for her CPAP machine.  Pt has not received anything from the DME yet and it [facility]. [facility] in [place] close to her house and she wants to use them instead of [facility] if they are in her network. Please advise PCC if this is able to be switched. Thanks. (please reply to triage)

## 2016-02-24 ENCOUNTER — Telehealth: Payer: Self-pay | Admitting: Pulmonary Disease

## 2016-02-24 NOTE — Telephone Encounter (Signed)
Spoke with pt and she states that both Qvar and Arnuity are very expensive on her insurance. They are going toward her deductible and she cannot afford. She reports that since starting CPAP therapy her breathing has improved greatly. She is wondering if she can try to stop maintenance inhalers and only use albuterol for rescue as needed. Pt states that Arnuity made her mouth burn and food taste bad. If she needs to continue she would like to use Qvar and she will need samples.   PM Please advise. Thanks!

## 2016-02-24 NOTE — Telephone Encounter (Signed)
Spoke with pt and advised that she would need to contact insurance co to determine which inhaler would be equivalent to Qvar at a cheaper cost. Explained to pt that we are not able to know which inhaler costs how much. Pt voiced understanding and states that she will contact ins co and call us back. Nothing further needed at this time.

## 2016-02-25 NOTE — Telephone Encounter (Signed)
OK to just use albuterol as needed. We will reassess her symptom control at next visit.

## 2016-02-25 NOTE — Telephone Encounter (Signed)
PM please advise 

## 2016-02-25 NOTE — Telephone Encounter (Signed)
Called spoke with pt and is aware of recs. Nothing further needed 

## 2016-05-13 ENCOUNTER — Ambulatory Visit: Payer: Managed Care, Other (non HMO) | Admitting: Pulmonary Disease

## 2016-05-16 ENCOUNTER — Other Ambulatory Visit: Payer: Self-pay | Admitting: Physician Assistant

## 2016-09-08 ENCOUNTER — Other Ambulatory Visit: Payer: Self-pay | Admitting: Physician Assistant

## 2016-09-09 NOTE — Telephone Encounter (Signed)
Rx called in to pharmacy. 

## 2016-09-19 ENCOUNTER — Other Ambulatory Visit: Payer: Self-pay | Admitting: Physician Assistant

## 2016-09-23 ENCOUNTER — Ambulatory Visit: Payer: Self-pay | Admitting: Physician Assistant

## 2016-10-11 ENCOUNTER — Encounter: Payer: Self-pay | Admitting: Physician Assistant

## 2016-11-07 ENCOUNTER — Other Ambulatory Visit: Payer: Self-pay | Admitting: Physician Assistant

## 2016-11-21 ENCOUNTER — Other Ambulatory Visit: Payer: Self-pay | Admitting: Physician Assistant

## 2016-12-23 ENCOUNTER — Other Ambulatory Visit: Payer: Self-pay | Admitting: Physician Assistant

## 2016-12-26 ENCOUNTER — Telehealth: Payer: Self-pay | Admitting: Physician Assistant

## 2016-12-26 MED ORDER — METOPROLOL SUCCINATE ER 100 MG PO TB24: 100.0000 mg | ORAL_TABLET | Freq: Every day | ORAL | 0 refills | Status: DC

## 2016-12-26 NOTE — Telephone Encounter (Signed)
Patient has not been seen here yet. May refill the medication one time but must has appointment.

## 2016-12-26 NOTE — Telephone Encounter (Signed)
What is the name of the medication?metoprol  Have you contacted your pharmacy to request a refill? yes  Which pharmacy would you like this sent to?the drug [facility] in [place]   Patient notified that their request is being sent to the clinical staff for review and that they should receive a call once it is complete. If they do not receive a call within 24 hours they can check with their pharmacy or our office.

## 2016-12-26 NOTE — Telephone Encounter (Signed)
Patient aware and rx sent to pharmacy.  

## 2017-01-04 ENCOUNTER — Encounter: Payer: Self-pay | Admitting: Physician Assistant

## 2017-01-04 ENCOUNTER — Ambulatory Visit (INDEPENDENT_AMBULATORY_CARE_PROVIDER_SITE_OTHER): Payer: Managed Care, Other (non HMO) | Admitting: Physician Assistant

## 2017-01-04 VITALS — BP 111/74 | HR 76 | Temp 99.1°F | Ht 67.0 in | Wt 239.0 lb

## 2017-01-04 DIAGNOSIS — I10 Essential (primary) hypertension: Secondary | ICD-10-CM

## 2017-01-04 DIAGNOSIS — G4730 Sleep apnea, unspecified: Secondary | ICD-10-CM

## 2017-01-04 DIAGNOSIS — J4520 Mild intermittent asthma, uncomplicated: Secondary | ICD-10-CM

## 2017-01-04 DIAGNOSIS — F419 Anxiety disorder, unspecified: Secondary | ICD-10-CM

## 2017-01-04 DIAGNOSIS — R251 Tremor, unspecified: Secondary | ICD-10-CM

## 2017-01-04 DIAGNOSIS — K219 Gastro-esophageal reflux disease without esophagitis: Secondary | ICD-10-CM

## 2017-01-04 MED ORDER — LANSOPRAZOLE 30 MG PO CPDR: 30.0000 mg | DELAYED_RELEASE_CAPSULE | Freq: Two times a day (BID) | ORAL | 1 refills | Status: DC

## 2017-01-04 MED ORDER — CITALOPRAM HYDROBROMIDE 40 MG PO TABS: ORAL_TABLET | ORAL | 3 refills | Status: DC

## 2017-01-04 MED ORDER — METOPROLOL SUCCINATE ER 100 MG PO TB24: 100.0000 mg | ORAL_TABLET | Freq: Every day | ORAL | 3 refills | Status: DC

## 2017-01-04 MED ORDER — HYDROCHLOROTHIAZIDE 25 MG PO TABS: 25.0000 mg | ORAL_TABLET | Freq: Every day | ORAL | 0 refills | Status: DC

## 2017-01-04 MED ORDER — MONTELUKAST SODIUM 10 MG PO TABS: 10.0000 mg | ORAL_TABLET | Freq: Every evening | ORAL | 3 refills | Status: DC

## 2017-01-04 NOTE — Patient Instructions (Signed)
Food Choices for Gastroesophageal Reflux Disease, Adult When you have gastroesophageal reflux disease (GERD), the foods you eat and your eating habits are very important. Choosing the right foods can help ease your discomfort. What guidelines do I need to follow?  Choose fruits, vegetables, whole grains, and low-fat dairy products.  Choose low-fat meat, fish, and poultry.  Limit fats such as oils, salad dressings, butter, nuts, and avocado.  Keep a food diary. This helps you identify foods that cause symptoms.  Avoid foods that cause symptoms. These may be different for everyone.  Eat small meals often instead of 3 large meals a day.  Eat your meals slowly, in a place where you are relaxed.  Limit fried foods.  Cook foods using methods other than frying.  Avoid drinking alcohol.  Avoid drinking large amounts of liquids with your meals.  Avoid bending over or lying down until 2-3 hours after eating. What foods are not recommended? These are some foods and drinks that may make your symptoms worse: Vegetables  Tomatoes. Tomato juice. Tomato and spaghetti sauce. Chili peppers. Onion and garlic. Horseradish. Fruits  Oranges, grapefruit, and lemon (fruit and juice). Meats  High-fat meats, fish, and poultry. This includes hot dogs, ribs, ham, sausage, salami, and bacon. Dairy  Whole milk and chocolate milk. Sour cream. Cream. Butter. Ice cream. Cream cheese. Drinks  Coffee and tea. Bubbly (carbonated) drinks or energy drinks. Condiments  Hot sauce. Barbecue sauce. Sweets/Desserts  Chocolate and cocoa. Donuts. Peppermint and spearmint. Fats and Oils  High-fat foods. This includes French fries and potato chips. Other  Vinegar. Strong spices. This includes black pepper, white pepper, red pepper, cayenne, curry powder, cloves, ginger, and chili powder. The items listed above may not be a complete list of foods and drinks to avoid. Contact your dietitian for more information.    This information is not intended to replace advice given to you by your health care provider. Make sure you discuss any questions you have with your health care provider. Document Released: 02/14/2012 Document Revised: 01/21/2016 Document Reviewed: 06/19/2013 Elsevier Interactive Patient Education  2017 Elsevier Inc.  

## 2017-01-06 DIAGNOSIS — I10 Essential (primary) hypertension: Secondary | ICD-10-CM | POA: Insufficient documentation

## 2017-01-06 DIAGNOSIS — F419 Anxiety disorder, unspecified: Secondary | ICD-10-CM | POA: Insufficient documentation

## 2017-01-06 DIAGNOSIS — J4520 Mild intermittent asthma, uncomplicated: Secondary | ICD-10-CM | POA: Insufficient documentation

## 2017-01-06 DIAGNOSIS — G4730 Sleep apnea, unspecified: Secondary | ICD-10-CM | POA: Insufficient documentation

## 2017-01-06 DIAGNOSIS — K219 Gastro-esophageal reflux disease without esophagitis: Secondary | ICD-10-CM | POA: Insufficient documentation

## 2017-01-06 DIAGNOSIS — R251 Tremor, unspecified: Secondary | ICD-10-CM | POA: Insufficient documentation

## 2017-01-06 NOTE — Progress Notes (Signed)
BP 111/74   Pulse 76   Temp 99.1 F (37.3 C) (Oral)   Ht 5\' 7"  (1.702 m)   Wt 239 lb (108.4 kg)   BMI 37.43 kg/m    Subjective:    Patient ID: Elizabeth Krueger, female    DOB: 1966/04/03, 51 y.o.   MRN: [id number]  HPI: Elizabeth Krueger is a 51 y.o. female presenting on 01/04/2017 for New Patient (Initial Visit) (pt here today for medication refills)  This patient comes in for periodic recheck on medications and conditions including Hypertension, depression, allergy, asthma, GERD. Patient has also had increased tremor and slight twitching in head and neck. At the end of her speaking she has a very slight whistle. It happens after she is talking in his excitedly talking. She has not been controlling her GERD very well. She is ready for a gastro-evaluation. She has had some dysphagia and food getting caught. She tries to eat as small as she can.   All medications are reviewed today. There are no reports of any problems with the medications. All of the medical conditions are reviewed and updated.  Lab work is reviewed and will be ordered as medically necessary. There are no new problems reported with today's visit.   Relevant past medical, surgical, family and social history reviewed and updated as indicated. Allergies and medications reviewed and updated.  Past Medical History:  Diagnosis Date  . GERD (gastroesophageal reflux disease)   . IBS (irritable bowel syndrome)     Past Surgical History:  Procedure Laterality Date  . CESAREAN SECTION      Review of Systems  Constitutional: Positive for fatigue and unexpected weight change. Negative for activity change and fever.  HENT: Negative.   Eyes: Negative.   Respiratory: Positive for shortness of breath. Negative for cough, wheezing and stridor.   Cardiovascular: Negative.  Negative for chest pain, palpitations and leg swelling.  Gastrointestinal: Positive for abdominal distention and abdominal pain.  Endocrine: Negative.   Genitourinary:  Negative.  Negative for dysuria.  Musculoskeletal: Negative.   Skin: Negative.  Negative for color change and pallor.  Neurological: Negative.   Psychiatric/Behavioral: The patient is nervous/anxious.     Allergies as of 01/04/2017   No Known Allergies     Medication List       Accurate as of 01/04/17 11:59 PM. Always use your most recent med list.          albuterol (2.5 MG/3ML) 0.083% nebulizer solution Commonly known as:  PROVENTIL Take 2.5 mg by nebulization every 6 (six) hours as needed for wheezing or shortness of breath.   PROAIR HFA 108 (90 Base) MCG/ACT inhaler Generic drug:  albuterol Inhale 2 puffs into the lungs every 6 (six) hours as needed for wheezing or shortness of breath.   ALPRAZolam 0.5 MG tablet Commonly known as:  XANAX Take 1 tablet (0.5 mg total) by mouth 2 (two) times daily.   citalopram 40 MG tablet Commonly known as:  CELEXA TAKE ONE (1) TABLET EACH DAY   hydrochlorothiazide 25 MG tablet Commonly known as:  HYDRODIURIL Take 1 tablet (25 mg total) by mouth daily.   lansoprazole 30 MG capsule Commonly known as:  PREVACID Take 1 capsule (30 mg total) by mouth 2 (two) times daily.   metoprolol succinate 100 MG 24 hr tablet Commonly known as:  TOPROL-XL Take 1 tablet (100 mg total) by mouth daily. Take with or immediately following a meal.   montelukast 10 MG tablet Commonly known as:  SINGULAIR Take 1 tablet (10 mg total) by mouth every evening.   zolpidem 5 MG tablet Commonly known as:  AMBIEN Take 2.5 mg by mouth at bedtime as needed for sleep.          Objective:    BP 111/74   Pulse 76   Temp 99.1 F (37.3 C) (Oral)   Ht 5\' 7"  (1.702 m)   Wt 239 lb (108.4 kg)   BMI 37.43 kg/m   No Known Allergies  Physical Exam  Constitutional: She is oriented to person, place, and time. She appears well-developed and well-nourished.  HENT:  Head: Normocephalic and atraumatic.  Right Ear: Tympanic membrane, external ear and ear canal  normal.  Left Ear: Tympanic membrane, external ear and ear canal normal.  Nose: Nose normal. No rhinorrhea.  Mouth/Throat: Oropharynx is clear and moist and mucous membranes are normal. No oropharyngeal exudate or posterior oropharyngeal erythema.  Eyes: Conjunctivae and EOM are normal. Pupils are equal, round, and reactive to light.  Neck: Normal range of motion. Neck supple.  Cardiovascular: Normal rate, regular rhythm, normal heart sounds and intact distal pulses.   Pulmonary/Chest: Effort normal and breath sounds normal.  Abdominal: Soft. Bowel sounds are normal.  Neurological: She is alert and oriented to person, place, and time. She has normal reflexes. She displays tremor.  Slight twitch of head and neck vessels. There he slight whistle after the end of speaking  Skin: Skin is warm and dry. No rash noted.  Psychiatric: She has a normal mood and affect. Her behavior is normal. Judgment and thought content normal.        Assessment & Plan:   1. Gastroesophageal reflux disease without esophagitis - lansoprazole (PREVACID) 30 MG capsule; Take 1 capsule (30 mg total) by mouth 2 (two) times daily.  Dispense: 180 capsule; Refill: 1  2. Tremor Control GERD, area of tremor is in voice and slight end phrase whistle, worse when talking quickly or excitedly  3. Anxiety - citalopram (CELEXA) 40 MG tablet; TAKE ONE (1) TABLET EACH DAY  Dispense: 90 tablet; Refill: 3  4. Mild intermittent asthma, unspecified whether complicated - montelukast (SINGULAIR) 10 MG tablet; Take 1 tablet (10 mg total) by mouth every evening.  Dispense: 90 tablet; Refill: 3  5. Essential hypertension - metoprolol succinate (TOPROL-XL) 100 MG 24 hr tablet; Take 1 tablet (100 mg total) by mouth daily. Take with or immediately following a meal.  Dispense: 90 tablet; Refill: 3 - hydrochlorothiazide (HYDRODIURIL) 25 MG tablet; Take 1 tablet (25 mg total) by mouth daily.  Dispense: 90 tablet; Refill: 0  6. Sleep apnea,  unspecified type Follow recommendation of pulmonology   Current Outpatient Prescriptions:  .  albuterol (PROAIR HFA) 108 (90 Base) MCG/ACT inhaler, Inhale 2 puffs into the lungs every 6 (six) hours as needed for wheezing or shortness of breath., Disp: , Rfl:  .  albuterol (PROVENTIL) (2.5 MG/3ML) 0.083% nebulizer solution, Take 2.5 mg by nebulization every 6 (six) hours as needed for wheezing or shortness of breath., Disp: , Rfl:  .  ALPRAZolam (XANAX) 0.5 MG tablet, Take 1 tablet (0.5 mg total) by mouth 2 (two) times daily., Disp: 60 tablet, Rfl: 2 .  citalopram (CELEXA) 40 MG tablet, TAKE ONE (1) TABLET EACH DAY, Disp: 90 tablet, Rfl: 3 .  hydrochlorothiazide (HYDRODIURIL) 25 MG tablet, Take 1 tablet (25 mg total) by mouth daily., Disp: 90 tablet, Rfl: 0 .  lansoprazole (PREVACID) 30 MG capsule, Take 1 capsule (30 mg total) by  mouth 2 (two) times daily., Disp: 180 capsule, Rfl: 1 .  metoprolol succinate (TOPROL-XL) 100 MG 24 hr tablet, Take 1 tablet (100 mg total) by mouth daily. Take with or immediately following a meal., Disp: 90 tablet, Rfl: 3 .  montelukast (SINGULAIR) 10 MG tablet, Take 1 tablet (10 mg total) by mouth every evening., Disp: 90 tablet, Rfl: 3 .  zolpidem (AMBIEN) 5 MG tablet, Take 2.5 mg by mouth at bedtime as needed for sleep., Disp: , Rfl:   Continue all other maintenance medications as listed above.  Follow up plan: Return in about 6 months (around 07/07/2017) for recheck.  Educational handout given for GERD  Remus Loffler PA-C [facility] [address]  [place], [place] [address] [phone]   01/06/2017, 2:09 PM

## 2017-03-08 ENCOUNTER — Encounter: Payer: Self-pay | Admitting: Physician Assistant

## 2017-04-24 ENCOUNTER — Other Ambulatory Visit: Payer: Self-pay | Admitting: Physician Assistant

## 2017-04-25 NOTE — Telephone Encounter (Signed)
Called into Drug Store

## 2017-06-01 ENCOUNTER — Telehealth: Payer: Self-pay | Admitting: Physician Assistant

## 2017-06-06 ENCOUNTER — Other Ambulatory Visit: Payer: Self-pay | Admitting: Physician Assistant

## 2017-06-06 DIAGNOSIS — K219 Gastro-esophageal reflux disease without esophagitis: Secondary | ICD-10-CM

## 2017-07-25 ENCOUNTER — Other Ambulatory Visit: Payer: Self-pay | Admitting: Physician Assistant

## 2017-07-25 DIAGNOSIS — K219 Gastro-esophageal reflux disease without esophagitis: Secondary | ICD-10-CM

## 2017-08-02 ENCOUNTER — Emergency Department (HOSPITAL_COMMUNITY): Payer: Self-pay

## 2017-08-02 ENCOUNTER — Encounter (HOSPITAL_COMMUNITY): Payer: Self-pay | Admitting: Emergency Medicine

## 2017-08-02 ENCOUNTER — Other Ambulatory Visit: Payer: Self-pay

## 2017-08-02 ENCOUNTER — Observation Stay (HOSPITAL_COMMUNITY)
Admission: EM | Admit: 2017-08-02 | Discharge: 2017-08-03 | Disposition: A | Discharge status: Home / Self Care | Payer: Self-pay | Attending: Internal Medicine | Admitting: Internal Medicine

## 2017-08-02 DIAGNOSIS — R197 Diarrhea, unspecified: Secondary | ICD-10-CM

## 2017-08-02 DIAGNOSIS — Z87891 Personal history of nicotine dependence: Secondary | ICD-10-CM | POA: Insufficient documentation

## 2017-08-02 DIAGNOSIS — K219 Gastro-esophageal reflux disease without esophagitis: Secondary | ICD-10-CM | POA: Diagnosis present

## 2017-08-02 DIAGNOSIS — F419 Anxiety disorder, unspecified: Secondary | ICD-10-CM | POA: Diagnosis present

## 2017-08-02 DIAGNOSIS — J4520 Mild intermittent asthma, uncomplicated: Secondary | ICD-10-CM | POA: Diagnosis present

## 2017-08-02 DIAGNOSIS — I10 Essential (primary) hypertension: Secondary | ICD-10-CM | POA: Diagnosis present

## 2017-08-02 DIAGNOSIS — G4730 Sleep apnea, unspecified: Secondary | ICD-10-CM | POA: Diagnosis present

## 2017-08-02 DIAGNOSIS — R1010 Upper abdominal pain, unspecified: Secondary | ICD-10-CM

## 2017-08-02 DIAGNOSIS — R112 Nausea with vomiting, unspecified: Principal | ICD-10-CM

## 2017-08-02 DIAGNOSIS — Z79899 Other long term (current) drug therapy: Secondary | ICD-10-CM | POA: Insufficient documentation

## 2017-08-02 HISTORY — DX: Tic disorder, unspecified: F95.9

## 2017-08-02 LAB — URINALYSIS, ROUTINE W REFLEX MICROSCOPIC
Bilirubin Urine: NEGATIVE
GLUCOSE, UA: NEGATIVE mg/dL
Hgb urine dipstick: NEGATIVE
Ketones, ur: NEGATIVE mg/dL
Nitrite: NEGATIVE
PH: 5 (ref 5.0–8.0)
Protein, ur: NEGATIVE mg/dL
Specific Gravity, Urine: 1.017 (ref 1.005–1.030)

## 2017-08-02 LAB — COMPREHENSIVE METABOLIC PANEL
ALK PHOS: 78 U/L (ref 38–126)
ALT: 45 U/L (ref 14–54)
AST: 27 U/L (ref 15–41)
Albumin: 3.7 g/dL (ref 3.5–5.0)
Anion gap: 8 (ref 5–15)
BUN: 16 mg/dL (ref 6–20)
CALCIUM: 8.6 mg/dL — AB (ref 8.9–10.3)
CO2: 25 mmol/L (ref 22–32)
CREATININE: 1.04 mg/dL — AB (ref 0.44–1.00)
Chloride: 106 mmol/L (ref 101–111)
GFR calc non Af Amer: >60 mL/min (ref >60)
Glucose, Bld: 104 mg/dL — ABNORMAL HIGH (ref 65–99)
Potassium: 4.2 mmol/L (ref 3.5–5.1)
SODIUM: 139 mmol/L (ref 135–145)
Total Bilirubin: 0.5 mg/dL (ref 0.3–1.2)
Total Protein: 7.1 g/dL (ref 6.5–8.1)

## 2017-08-02 LAB — LIPASE, BLOOD: Lipase: 34 U/L (ref 11–51)

## 2017-08-02 LAB — CBC
HCT: 42.4 % (ref 36.0–46.0)
Hemoglobin: 13.8 g/dL (ref 12.0–15.0)
MCH: 30.5 pg (ref 26.0–34.0)
MCHC: 32.5 g/dL (ref 30.0–36.0)
MCV: 93.6 fL (ref 78.0–100.0)
PLATELETS: 248 10*3/uL (ref 150–400)
RBC: 4.53 MIL/uL (ref 3.87–5.11)
RDW: 12.8 % (ref 11.5–15.5)
WBC: 12.6 10*3/uL — ABNORMAL HIGH (ref 4.0–10.5)

## 2017-08-02 LAB — POC URINE PREG, ED: Preg Test, Ur: NEGATIVE

## 2017-08-02 LAB — TSH: TSH: 1.325 u[IU]/mL (ref 0.350–4.500)

## 2017-08-02 MED ORDER — PROMETHAZINE HCL 25 MG/ML IJ SOLN
12.5000 mg | Freq: Once | INTRAMUSCULAR | Status: AC
  Administered 2017-08-02: 12.5 mg via INTRAVENOUS
  Filled 2017-08-02: qty 1

## 2017-08-02 MED ORDER — SODIUM CHLORIDE 0.9 % IV BOLUS (SEPSIS)
1000.0000 mL | Freq: Once | INTRAVENOUS | Status: AC
  Administered 2017-08-02: 1000 mL via INTRAVENOUS

## 2017-08-02 MED ORDER — ONDANSETRON HCL 4 MG/2ML IJ SOLN
4.0000 mg | Freq: Once | INTRAMUSCULAR | Status: AC
  Administered 2017-08-02: 4 mg via INTRAVENOUS
  Filled 2017-08-02: qty 2

## 2017-08-02 NOTE — ED Notes (Signed)
Pt states the Phenergan helped her nausea and pain. Pt sleeping at this time.

## 2017-08-02 NOTE — ED Provider Notes (Signed)
[facility]ENCY [facility]MENT Provider Note   CSN: [id number] Arrival date & time: 08/02/17  1659     History   Chief Complaint Chief Complaint  Patient presents with  . Emesis    HPI Elizabeth LevansMelissa Koop is a 51 y.o. female.  HPI Patient presents with nausea vomiting diarrhea.  She has been dealing with this for the last couple years.  Comes and goes.  Is been diagnosed with irritable bowel in the past.  Has decreased appetite.  Some dull abdominal pain.  Began on Friday with today being Wednesday.  Had one day of relief on Sunday but otherwise been feeling bad.  Decreased appetite.  Has had some loose stools and has had some dark green stools.  Only abdominal surgeries been a cesarean section.  Has been under some stress at work.  Has history of sleep apnea but is not using her CPAP. Past Medical History:  Diagnosis Date  . GERD (gastroesophageal reflux disease)   . IBS (irritable bowel syndrome)     Patient Active Problem List   Diagnosis Date Noted  . Gastroesophageal reflux disease without esophagitis 01/06/2017  . Tremor 01/06/2017  . Anxiety 01/06/2017  . Mild intermittent asthma 01/06/2017  . Essential hypertension 01/06/2017  . Sleep apnea 01/06/2017    Past Surgical History:  Procedure Laterality Date  . CESAREAN SECTION      OB History    No data available       Home Medications    Prior to Admission medications   Medication Sig Start Date End Date Taking? Authorizing Provider  albuterol (PROAIR HFA) 108 (90 Base) MCG/ACT inhaler Inhale 2 puffs into the lungs every 6 (six) hours as needed for wheezing or shortness of breath.   Yes [provider]  albuterol (PROVENTIL) (2.5 MG/3ML) 0.083% nebulizer solution Take 2.5 mg by nebulization every 6 (six) hours as needed for wheezing or shortness of breath.   Yes [provider]  citalopram (CELEXA) 40 MG tablet TAKE ONE (1) TABLET EACH DAY 01/04/17  Yes Remus LofflerJones, Angel S, PA-C  hydrochlorothiazide  (HYDRODIURIL) 25 MG tablet Take 1 tablet (25 mg total) by mouth daily. Patient taking differently: Take 25 mg by mouth daily as needed (FOR FLUID).  01/04/17  Yes Remus LofflerJones, Angel S, PA-C  ibuprofen (ADVIL,MOTRIN) 200 MG tablet Take 400 mg by mouth every 6 (six) hours as needed for fever or moderate pain.   Yes [provider]  lansoprazole (PREVACID) 30 MG capsule TAKE ONE CAPSULE BY MOUTH TWICE A DAY Patient taking differently: TAKE TWO CAPSULES BY MOUTH TWICE A DAY 06/07/17  Yes Remus LofflerJones, Angel S, PA-C  metoprolol succinate (TOPROL-XL) 100 MG 24 hr tablet Take 1 tablet (100 mg total) by mouth daily. Take with or immediately following a meal. 01/04/17  Yes Remus LofflerJones, Angel S, PA-C  montelukast (SINGULAIR) 10 MG tablet Take 1 tablet (10 mg total) by mouth every evening. 01/04/17  Yes Remus LofflerJones, Angel S, PA-C  Multiple Vitamin (MULTIVITAMIN WITH MINERALS) TABS tablet Take 1 tablet by mouth every evening.   Yes [provider]  ALPRAZolam Prudy Feeler(XANAX) 0.5 MG tablet TAKE ONE TABLET BY MOUTH TWICE DAILY Patient not taking: Reported on 08/02/2017 04/25/17   Remus LofflerJones, Angel S, PA-C    Family History Family History  Problem Relation Age of Onset  . Prostate cancer Father   . Diabetes Father   . Heart disease Father   . COPD Father   . Rheum arthritis Father   . Diabetes Mother   .  Stomach cancer Maternal Grandmother   . Liver cancer Paternal Grandfather     Social History Social History   Tobacco Use  . Smoking status: Former Smoker    Packs/day: 0.10    Years: 2.00    Pack years: 0.20    Types: Pipe, Cigarettes    Last attempt to quit: 08/29/2000    Years since quitting: 16.9  . Smokeless tobacco: Never Used  Substance Use Topics  . Alcohol use: No    Alcohol/week: 0.0 oz  . Drug use: No     Allergies   Patient has no known allergies.   Review of Systems Review of Systems  Constitutional: Positive for appetite change. Negative for chills and fever.  HENT: Negative for congestion.     Respiratory: Negative for shortness of breath.   Cardiovascular: Negative for chest pain.  Gastrointestinal: Positive for abdominal pain, diarrhea, nausea and vomiting.  Genitourinary: Negative for flank pain.  Musculoskeletal: Negative for back pain.  Skin: Negative for rash.  Neurological: Negative for tremors.  Psychiatric/Behavioral: Negative for confusion.     Physical Exam Updated Vital Signs BP 138/85 (BP Location: Left Arm)   Pulse 77   Temp 98.6 F (37 C) (Oral)   Resp 20   Ht 5\' 7"  (1.702 m)   Wt 108.9 kg (240 lb)   LMP 07/12/2017   SpO2 100%   BMI 37.59 kg/m   Physical Exam  Constitutional: She appears well-developed.  Patient appears uncomfortable  HENT:  Head: Atraumatic.  Eyes: Pupils are equal, round, and reactive to light.  Neck: Neck supple.  Cardiovascular: Normal rate.  Pulmonary/Chest: Effort normal.  Abdominal: There is no tenderness.  Musculoskeletal: She exhibits no edema.  Neurological: She is alert.  Skin: Skin is warm. Capillary refill takes less than 2 seconds.  Psychiatric: She has a normal mood and affect.     ED Treatments / Results  Labs (all labs ordered are listed, but only abnormal results are displayed) Labs Reviewed  COMPREHENSIVE METABOLIC PANEL - Abnormal; Notable for the following components:      Result Value   Glucose, Bld 104 (*)    Creatinine, Ser 1.04 (*)    Calcium 8.6 (*)    All other components within normal limits  CBC - Abnormal; Notable for the following components:   WBC 12.6 (*)    All other components within normal limits  URINALYSIS, ROUTINE W REFLEX MICROSCOPIC - Abnormal; Notable for the following components:   APPearance HAZY (*)    Leukocytes, UA SMALL (*)    Bacteria, UA RARE (*)    Squamous Epithelial / LPF 6-30 (*)    All other components within normal limits  LIPASE, BLOOD  TSH  POC URINE PREG, ED    EKG  EKG Interpretation None       Radiology No results  found.  Procedures Procedures (including critical care time)  Medications Ordered in ED Medications  sodium chloride 0.9 % bolus 1,000 mL (0 mLs Intravenous Stopped 08/02/17 1928)  ondansetron (ZOFRAN) injection 4 mg (4 mg Intravenous Given 08/02/17 1750)  promethazine (PHENERGAN) injection 12.5 mg (12.5 mg Intravenous Given 08/02/17 1939)  sodium chloride 0.9 % bolus 1,000 mL (1,000 mLs Intravenous New Bag/Given 08/02/17 2154)  promethazine (PHENERGAN) injection 12.5 mg (12.5 mg Intravenous Given 08/02/17 2137)     Initial Impression / Assessment and Plan / ED Course  I have reviewed the triage vital signs and the nursing notes.  Pertinent labs & imaging results that were  available during my care of the patient were reviewed by me and considered in my medical decision making (see chart for details).     Patient with nausea vomiting abdominal pain.  Has had episodes on and off for a while.  Labs overall reassuring but mildly elevated white count.  Continues to vomit here.  Rather benign exam but does complain of upper abdominal pain.  Will discuss with hospitalist about possible observation and potentially ultrasound in the morning.  Has had previous CAT scan although was around 5 years ago with no abnormalities at that time.  Final Clinical Impressions(s) / ED Diagnoses   Final diagnoses:  Non-intractable vomiting with nausea, unspecified vomiting type  Upper abdominal pain    ED Discharge Orders    None       Benjiman Core, MD 08/02/17 2301

## 2017-08-02 NOTE — ED Notes (Signed)
Patient is not tolerating fluids.  Patient vomited 500 mL.

## 2017-08-02 NOTE — ED Notes (Signed)
Pt vomited 500 mL after trying to drink fluids and eat a cracker.

## 2017-08-02 NOTE — ED Triage Notes (Signed)
Pt reports N/V/D started Friday, subsided during the weekend, and began again today. 3 episodes of emesis and 2 episodes of V/. States she has had "dark green" BM .

## 2017-08-03 ENCOUNTER — Emergency Department (HOSPITAL_COMMUNITY): Payer: Self-pay

## 2017-08-03 ENCOUNTER — Encounter (HOSPITAL_COMMUNITY): Payer: Self-pay | Admitting: Family Medicine

## 2017-08-03 ENCOUNTER — Telehealth: Payer: Self-pay | Admitting: Gastroenterology

## 2017-08-03 DIAGNOSIS — R112 Nausea with vomiting, unspecified: Secondary | ICD-10-CM | POA: Diagnosis present

## 2017-08-03 DIAGNOSIS — R197 Diarrhea, unspecified: Secondary | ICD-10-CM

## 2017-08-03 DIAGNOSIS — I10 Essential (primary) hypertension: Secondary | ICD-10-CM

## 2017-08-03 DIAGNOSIS — K219 Gastro-esophageal reflux disease without esophagitis: Secondary | ICD-10-CM

## 2017-08-03 DIAGNOSIS — F419 Anxiety disorder, unspecified: Secondary | ICD-10-CM

## 2017-08-03 LAB — CBC
HEMATOCRIT: 40.4 % (ref 36.0–46.0)
HEMOGLOBIN: 13 g/dL (ref 12.0–15.0)
MCH: 30.7 pg (ref 26.0–34.0)
MCHC: 32.2 g/dL (ref 30.0–36.0)
MCV: 95.5 fL (ref 78.0–100.0)
Platelets: 216 10*3/uL (ref 150–400)
RBC: 4.23 MIL/uL (ref 3.87–5.11)
RDW: 13.1 % (ref 11.5–15.5)
WBC: 10.4 10*3/uL (ref 4.0–10.5)

## 2017-08-03 LAB — BASIC METABOLIC PANEL
ANION GAP: 6 (ref 5–15)
BUN: 14 mg/dL (ref 6–20)
CALCIUM: 8.8 mg/dL — AB (ref 8.9–10.3)
CO2: 27 mmol/L (ref 22–32)
Chloride: 107 mmol/L (ref 101–111)
Creatinine, Ser: 0.93 mg/dL (ref 0.44–1.00)
GLUCOSE: 140 mg/dL — AB (ref 65–99)
POTASSIUM: 4.6 mmol/L (ref 3.5–5.1)
SODIUM: 140 mmol/L (ref 135–145)

## 2017-08-03 MED ORDER — ACETAMINOPHEN 650 MG RE SUPP: 650.0000 mg | Freq: Four times a day (QID) | RECTAL | Status: DC | PRN

## 2017-08-03 MED ORDER — INFLUENZA VAC SPLIT QUAD 0.5 ML IM SUSY
0.5000 mL | PREFILLED_SYRINGE | INTRAMUSCULAR | Status: DC
Start: 2017-08-04 — End: 2017-08-03

## 2017-08-03 MED ORDER — ZOLPIDEM TARTRATE 5 MG PO TABS: 5.0000 mg | ORAL_TABLET | Freq: Every evening | ORAL | Status: DC | PRN

## 2017-08-03 MED ORDER — ONDANSETRON HCL 4 MG/2ML IJ SOLN: 4.0000 mg | Freq: Four times a day (QID) | INTRAMUSCULAR | Status: DC | PRN

## 2017-08-03 MED ORDER — CITALOPRAM HYDROBROMIDE 20 MG PO TABS
40.0000 mg | ORAL_TABLET | Freq: Every day | ORAL | Status: DC
  Administered 2017-08-03: 40 mg via ORAL
  Filled 2017-08-03 (×2): qty 1
  Filled 2017-08-03: qty 2

## 2017-08-03 MED ORDER — MONTELUKAST SODIUM 10 MG PO TABS: 10.0000 mg | ORAL_TABLET | Freq: Every evening | ORAL | Status: DC

## 2017-08-03 MED ORDER — METOCLOPRAMIDE HCL 5 MG PO TABS: 5.0000 mg | ORAL_TABLET | Freq: Three times a day (TID) | ORAL | 0 refills | Status: DC | PRN

## 2017-08-03 MED ORDER — METOPROLOL SUCCINATE ER 50 MG PO TB24
100.0000 mg | ORAL_TABLET | Freq: Every day | ORAL | Status: DC
  Administered 2017-08-03: 100 mg via ORAL
  Filled 2017-08-03 (×2): qty 1
  Filled 2017-08-03: qty 2

## 2017-08-03 MED ORDER — ACETAMINOPHEN 325 MG PO TABS: 650.0000 mg | ORAL_TABLET | Freq: Four times a day (QID) | ORAL | Status: DC | PRN

## 2017-08-03 MED ORDER — IOPAMIDOL (ISOVUE-300) INJECTION 61%
100.0000 mL | Freq: Once | INTRAVENOUS | Status: AC | PRN
  Administered 2017-08-03: 100 mL via INTRAVENOUS

## 2017-08-03 MED ORDER — ENOXAPARIN SODIUM 60 MG/0.6ML ~~LOC~~ SOLN
50.0000 mg | SUBCUTANEOUS | Status: DC
  Filled 2017-08-03: qty 0.6

## 2017-08-03 MED ORDER — HYDRALAZINE HCL 20 MG/ML IJ SOLN: 10.0000 mg | Freq: Three times a day (TID) | INTRAMUSCULAR | Status: DC | PRN

## 2017-08-03 MED ORDER — PANTOPRAZOLE SODIUM 40 MG PO TBEC
40.0000 mg | DELAYED_RELEASE_TABLET | Freq: Every day | ORAL | Status: DC
Start: 2017-08-03 — End: 2017-08-03
  Administered 2017-08-03: 40 mg via ORAL
  Filled 2017-08-03: qty 1

## 2017-08-03 MED ORDER — MORPHINE SULFATE (PF) 4 MG/ML IV SOLN
4.0000 mg | INTRAVENOUS | Status: DC | PRN
  Administered 2017-08-03: 4 mg via INTRAVENOUS
  Filled 2017-08-03: qty 1

## 2017-08-03 MED ORDER — SODIUM CHLORIDE 0.9 % IV SOLN
INTRAVENOUS | Status: DC
  Administered 2017-08-03: 03:00:00 via INTRAVENOUS

## 2017-08-03 MED ORDER — SODIUM CHLORIDE 0.9% FLUSH: 3.0000 mL | Freq: Two times a day (BID) | INTRAVENOUS | Status: DC

## 2017-08-03 MED ORDER — HYDROCHLOROTHIAZIDE 25 MG PO TABS: 25.0000 mg | ORAL_TABLET | Freq: Every day | ORAL | Status: DC | PRN

## 2017-08-03 MED ORDER — ONDANSETRON HCL 4 MG PO TABS: 4.0000 mg | ORAL_TABLET | Freq: Four times a day (QID) | ORAL | Status: DC | PRN

## 2017-08-03 MED ORDER — ALBUTEROL SULFATE (2.5 MG/3ML) 0.083% IN NEBU: 2.5000 mg | INHALATION_SOLUTION | Freq: Four times a day (QID) | RESPIRATORY_TRACT | Status: DC | PRN

## 2017-08-03 NOTE — ED Notes (Signed)
Pt transported to CT ?

## 2017-08-03 NOTE — Telephone Encounter (Signed)
PATIENT SCHEDULED AND NURSE ON FLOOR WILL NOTIFY PATIENT

## 2017-08-03 NOTE — Telephone Encounter (Signed)
Received phone call from Dr. Gonzella Lexhungel, requesting outpatient appointment for N/V/D for this patient. Significant poorly controlled GERD and may need EGD. She has remotely seen [facility] but would accept patient given Dr. Valora Piccolohungel's request.   Please schedule OV within next 2-3 weeks and let hospital (patient is in [facility] and plans for discharge today) know of appt date/time.

## 2017-08-03 NOTE — Progress Notes (Signed)
Patient discharged home.  AVS reviewed, instructed to follow up with PCP and GI.  Verbalizes understanding.  RX given to patient.  No questions at this time.  Patient in NAD.

## 2017-08-03 NOTE — H&P (Signed)
[facility] History and Physical  Elizabeth LevansMelissa Hausman UJW:[id number] DOB: 10/11/1965 DOA: 08/02/2017  Referring physician:  PCP: Remus LofflerJones, Angel S, PA-C   Chief Complaint: "I just want to get better."  HPI: Elizabeth Krueger is a 51 y.o. female with past medical history of IBS and GERD who presents to the emergency room with nausea vomiting and diarrhea.  Patient has had symptoms since last Friday.  Symptoms have not improved.  Patient states this happens monthly at least.  Was previously followed by gastroenterology 7-8 years ago.  Spouse states that episodes get worse with stress.  ED Course: Note patient's father was just admitted to the hospital.  Patient received 3 rounds of antiemetic with no relief of nausea.  Vomiting did cease after several hours.  Patient too nauseous to do p.o. trial.  Admitted to the hospital by hospitalist.   Review of Systems:  As per HPI otherwise 10 point review of systems negative.    Past Medical History:  Diagnosis Date  . Facial tic   . GERD (gastroesophageal reflux disease)   . IBS (irritable bowel syndrome)    Past Surgical History:  Procedure Laterality Date  . CESAREAN SECTION     Social History:  reports that she quit smoking about 16 years ago. Her smoking use included pipe and cigarettes. She has a 0.20 pack-year smoking history. she has never used smokeless tobacco. She reports that she does not drink alcohol or use drugs.  No Known Allergies  Family History  Problem Relation Age of Onset  . Prostate cancer Father   . Diabetes Father   . Heart disease Father   . COPD Father   . Rheum arthritis Father   . Diabetes Mother   . Stomach cancer Maternal Grandmother   . Liver cancer Paternal Grandfather      Prior to Admission medications   Medication Sig Start Date End Date Taking? Authorizing Provider  albuterol (PROAIR HFA) 108 (90 Base) MCG/ACT inhaler Inhale 2 puffs into the lungs every 6 (six) hours as needed for wheezing or  shortness of breath.   Yes [provider]  albuterol (PROVENTIL) (2.5 MG/3ML) 0.083% nebulizer solution Take 2.5 mg by nebulization every 6 (six) hours as needed for wheezing or shortness of breath.   Yes [provider]  citalopram (CELEXA) 40 MG tablet TAKE ONE (1) TABLET EACH DAY 01/04/17  Yes Remus LofflerJones, Angel S, PA-C  hydrochlorothiazide (HYDRODIURIL) 25 MG tablet Take 1 tablet (25 mg total) by mouth daily. Patient taking differently: Take 25 mg by mouth daily as needed (FOR FLUID).  01/04/17  Yes Remus LofflerJones, Angel S, PA-C  ibuprofen (ADVIL,MOTRIN) 200 MG tablet Take 400 mg by mouth every 6 (six) hours as needed for fever or moderate pain.   Yes [provider]  lansoprazole (PREVACID) 30 MG capsule TAKE ONE CAPSULE BY MOUTH TWICE A DAY Patient taking differently: TAKE TWO CAPSULES BY MOUTH TWICE A DAY 06/07/17  Yes Remus LofflerJones, Angel S, PA-C  metoprolol succinate (TOPROL-XL) 100 MG 24 hr tablet Take 1 tablet (100 mg total) by mouth daily. Take with or immediately following a meal. 01/04/17  Yes Remus LofflerJones, Angel S, PA-C  montelukast (SINGULAIR) 10 MG tablet Take 1 tablet (10 mg total) by mouth every evening. 01/04/17  Yes Remus LofflerJones, Angel S, PA-C  Multiple Vitamin (MULTIVITAMIN WITH MINERALS) TABS tablet Take 1 tablet by mouth every evening.   Yes [provider]  ALPRAZolam (XANAX) 0.5 MG tablet TAKE ONE TABLET BY MOUTH TWICE DAILY Patient not taking:  Reported on 08/02/2017 04/25/17   Remus LofflerJones, Angel S, PA-C   Physical Exam: Vitals:   08/02/17 2330 08/03/17 0000 08/03/17 0030 08/03/17 0100  BP: 120/76 104/70 104/73 113/66  Pulse: 82 78 93 85  Resp:      Temp:      TempSrc:      SpO2: 97% 95% 97% 95%  Weight:      Height:        Wt Readings from Last 3 Encounters:  08/02/17 108.9 kg (240 lb)  01/04/17 108.4 kg (239 lb)  12/18/15 105.4 kg (232 lb 6.4 oz)    General:  Appears calm and comfortable; A&Ox3 Eyes:  PERRL, EOMI, normal lids, iris ENT:  grossly normal hearing, lips &  tongue Neck:  no LAD, masses or thyromegaly Cardiovascular:  RRR, no m/r/g. No LE edema.  Respiratory:  CTA bilaterally, no w/r/r. Normal respiratory effort. Abdomen:  soft, ND, tender diffuse Skin:  no rash or induration seen on limited exam Musculoskeletal:  grossly normal tone BUE/BLE Psychiatric:  Tearful/anxious Neurologic:  CN 2-12 grossly intact, moves all extremities in coordinated fashion.          Labs on Admission:  Basic Metabolic Panel: Recent Labs  Lab 08/02/17 1830  NA 139  K 4.2  CL 106  CO2 25  GLUCOSE 104*  BUN 16  CREATININE 1.04*  CALCIUM 8.6*   Liver Function Tests: Recent Labs  Lab 08/02/17 1830  AST 27  ALT 45  ALKPHOS 78  BILITOT 0.5  PROT 7.1  ALBUMIN 3.7   Recent Labs  Lab 08/02/17 1830  LIPASE 34   No results for input(s): AMMONIA in the last 168 hours. CBC: Recent Labs  Lab 08/02/17 1742  WBC 12.6*  HGB 13.8  HCT 42.4  MCV 93.6  PLT 248   Cardiac Enzymes: No results for input(s): CKTOTAL, CKMB, CKMBINDEX, TROPONINI in the last 168 hours.  BNP (last 3 results) No results for input(s): BNP in the last 8760 hours.  ProBNP (last 3 results) No results for input(s): PROBNP in the last 8760 hours.   Serum creatinine: 1.04 mg/dL (H) 96/11/5410/05/18 [id number] Estimated creatinine clearance: 81.3 mL/min (A)  CBG: No results for input(s): GLUCAP in the last 168 hours.  Radiological Exams on Admission: Dg Abd 2 Views  Result Date: 08/02/2017 CLINICAL DATA:  Nausea, vomiting and diarrhea. EXAM: ABDOMEN - 2 VIEW COMPARISON:  Body CT 07/25/2012 FINDINGS: Nonobstructive/nonspecific bowel gas pattern with relative paucity of small bowel gas. There is no evidence of free air. No radio-opaque calculi or other significant radiographic abnormality is seen. IMPRESSION: Nonobstructive/nonspecific bowel gas pattern with relative paucity of small bowel gas. Electronically Signed   By: Ted Mcalpineobrinka  Dimitrova M.D.   On: 08/02/2017 23:36    EKG: no  new  Assessment/Plan Principal Problem:   Nausea vomiting and diarrhea Active Problems:   Gastroesophageal reflux disease without esophagitis   Anxiety   Mild intermittent asthma   Essential hypertension   Sleep apnea  N/V/D and abd pain Zofran prn Morphine prn CT with Cont in AM IVF NPO for now  Asthma Prn albuterol Cont singulair  Anxiety Cont celexa  Hypertension When necessary hydralazine 10 mg IV as needed for severe blood pressure Cont HCTZ prn Cont lopressor  GERD Cont PPI  Code Status: FC DVT Prophylaxis: lovenox Family Communication: husband at bedside Disposition Plan: Pending Improvement  Status: tele obs  Haydee SalterPhillip M Petros Ahart, MD Family Medicine [facility] [email or web address] Password [email or web address]

## 2017-08-03 NOTE — Care Management (Signed)
Admitted with N/V/D. Chart reviewed for CM needs. Pt is from home, ind, has PCP and transportation to appointments. No insurance, [facility] She is being discharged home with self care. Rx given for narcotics and OTC medications. No appropriate for MATCH. No CM needs noted at this time.

## 2017-08-03 NOTE — Discharge Instructions (Signed)
Gastroesophageal Reflux Scan A gastroesophageal reflux scan is a procedure that is used to check for gastroesophageal reflux, which is the backward flow of stomach contents into the tube that carries food from the mouth to the stomach (esophagus). The scan can also show if any stomach contents are inhaled (aspirated) into your lungs. You may need this scan if you have symptoms such as heartburn, vomiting, swallowing problems, or regurgitation. Regurgitation means that swallowed food is returning from the stomach to the esophagus. For this scan, you will drink a liquid that contains a small amount of a radioactive substance (tracer). A scanner with a camera that detects the radioactive tracer is used to see if any of the material backs up into your esophagus. Tell a health care provider about:  Any allergies you have.  All medicines you are taking, including vitamins, herbs, eye drops, creams, and over-the-counter medicines.  Any blood disorders you have.  Any surgeries you have had.  Any medical conditions you have.  If you are pregnant or you think that you may be pregnant.  If you are breastfeeding. What are the risks? Generally, this is a safe procedure. However, problems may occur, including:  Exposure to radiation (a small amount).  Allergic reaction to the radioactive substance. This is rare.  What happens before the procedure?  Ask your health care provider about changing or stopping your regular medicines. This is especially important if you are taking diabetes medicines or blood thinners.  Follow your health care provider's instructions about eating or drinking restrictions. What happens during the procedure?  You will be asked to drink a liquid that contains a small amount of a radioactive tracer. This liquid will probably be similar to orange juice.  You will assume a position lying on your back.  A series of images will be taken of your esophagus and upper  stomach.  You may be asked to move into different positions to help determine if reflux occurs more often when you are in specific positions.  For adults, an abdominal binder with an inflatable cuff may be placed on the belly (abdomen). This may be used to increase abdominal pressure. More images will be taken to see if the increased pressure causes reflux to occur. The procedure may vary among health care providers and hospitals. What happens after the procedure?  Return to your normal activities and your normal diet as directed by your health care provider.  The radioactive tracer will leave your body over the next few days. Drink enough fluid to keep your urine clear or pale yellow. This will help to flush the tracer out of your body.  It is your responsibility to obtain your test results. Ask your health care provider or the department performing the test when and how you will get your results. This information is not intended to replace advice given to you by your health care provider. Make sure you discuss any questions you have with your health care provider. Document Released: 10/06/2005 Document Revised: 05/09/2016 Document Reviewed: 05/27/2014 Elsevier Interactive Patient Education  2018 [employer]  

## 2017-08-03 NOTE — Discharge Summary (Signed)
Physician Discharge Summary  Sydnee LevansMelissa Kock AVW:[id number] DOB: 03/24/1966 DOA: 08/02/2017  PCP: Remus LofflerJones, Angel S, PA-C  Admit date: 08/02/2017 Discharge date: 08/03/2017  Admitted From: Home Disposition:  Home  Recommendations for Outpatient Follow-up:  1. Follow up with PCP in 1-2 weeks 2. GI (Dr.Rourke's office) will arrange outpatient appointment.  Home Health:None Equipment/Devices:None  Discharge Condition:Fair CODE STATUS:Full code Diet recommendation: Full liquid, advance to regular    Discharge Diagnoses:  Principal Problem:   Nausea vomiting and diarrhea   Active Problems:   Gastroesophageal reflux disease without esophagitis   Anxiety   Mild intermittent asthma   Essential hypertension   Sleep apnea     Brief narrative/history of present illness 51 year old obese female with IBS and GERD presented with Nausea, vomiting and diarrhea since last few days. Patient reports chronic epigastric pain for which she has been on twice-daily Protonix by her PCP.She reports having episodic Symptoms of Hiccups resulting into Nausea and vomiting and epigastric pain. Denies Having any endoscopy or GI workup done in the past.Symptoms get worse with stress. No new medications or recent illness. No hematemesis or melena. Patient received 3 rounds of antiemetics without relief in the ED. Had one episode of vomiting in the ED which resolved. Vitals in the ED was normal. Blood work, chest x-ray and abdominal x-ray unremarkable. Patient Observed overnight.  Hospital course Nausea/vomiting and diarrhea Secondary to acute gastroenteritis versus severe GERD. Hydrated with IV fluids.Symptoms have Resolved this morning and tolerating full liquid diet. Continue PPI upon discharge. Continue liquid Diet and advance slowly by tomorrow. Patient takes Motrin for pain and have instructed to avoid NSAIDs given concern for severe GERD. Prescribe short course of Reglan as needed for intermittent  hiccups.  GERD with? Severe gastritis Symptoms ongoing for a few years now without Improvement Even after increasing her PPI to twice a day by her PCP 6 months back. No prior GI workup and patient would benefit from EGD as outpatient. I have spoken with GI and they will arrange outpatient follow-up. Patient instructed to avoid NSAIDs.  Remaining medical issues are stable. Continue remaining home medications.  Family communication: Husband at bedside   Discharge Instructions   Allergies as of 08/03/2017   No Known Allergies     Medication List    STOP taking these medications   ALPRAZolam 0.5 MG tablet Commonly known as:  XANAX   ibuprofen 200 MG tablet Commonly known as:  ADVIL,MOTRIN     TAKE these medications   albuterol (2.5 MG/3ML) 0.083% nebulizer solution Commonly known as:  PROVENTIL Take 2.5 mg by nebulization every 6 (six) hours as needed for wheezing or shortness of breath.   PROAIR HFA 108 (90 Base) MCG/ACT inhaler Generic drug:  albuterol Inhale 2 puffs into the lungs every 6 (six) hours as needed for wheezing or shortness of breath.   citalopram 40 MG tablet Commonly known as:  CELEXA TAKE ONE (1) TABLET EACH DAY   hydrochlorothiazide 25 MG tablet Commonly known as:  HYDRODIURIL Take 1 tablet (25 mg total) by mouth daily. What changed:    when to take this  reasons to take this   lansoprazole 30 MG capsule Commonly known as:  PREVACID TAKE ONE CAPSULE BY MOUTH TWICE A DAY What changed:    how much to take  how to take this  when to take this   metoCLOPramide 5 MG tablet Commonly known as:  REGLAN Take 1 tablet (5 mg total) by mouth every 8 (eight) hours as needed  for nausea (hiccups).   metoprolol succinate 100 MG 24 hr tablet Commonly known as:  TOPROL-XL Take 1 tablet (100 mg total) by mouth daily. Take with or immediately following a meal.   montelukast 10 MG tablet Commonly known as:  SINGULAIR Take 1 tablet (10 mg total) by mouth  every evening.   multivitamin with minerals Tabs tablet Take 1 tablet by mouth every evening.      Follow-up Information    Remus Loffler, PA-C. Schedule an appointment as soon as possible for a visit in 1 week(s).   Specialties:  Physician Assistant, Family Medicine Contact information: [address] [place] [address] [phone]        Corbin Ade, MD Follow up in 4 week(s).   Specialty:  Gastroenterology Why:  office will call with appointment Contact information: [address] [place] [address] [phone]          No Known Allergies  Procedures/Studies: Ct Abdomen Pelvis W Contrast  Result Date: 08/03/2017 CLINICAL DATA:  51 y/o  F; nausea and vomiting. EXAM: CT ABDOMEN AND PELVIS WITH CONTRAST TECHNIQUE: Multidetector CT imaging of the abdomen and pelvis was performed using the standard protocol following bolus administration of intravenous contrast. CONTRAST:  ISOVUE-300 IOPAMIDOL (ISOVUE-300) INJECTION 61% COMPARISON:  07/25/2012 CT abdomen and pelvis FINDINGS: Lower chest: No acute abnormality. Hepatobiliary: Stable 2.1 cm liver segment 7/8 hemangioma and 0.5 cm probable hemangioma in liver segment 6 (series 2, image 33 and 11). Normal gallbladder. Hepatic steatosis. No biliary ductal dilatation. Pancreas: Unremarkable. No pancreatic ductal dilatation or surrounding inflammatory changes. Spleen: Normal in size without focal abnormality. Adrenals/Urinary Tract: Adrenal glands are unremarkable. Kidneys are normal, without renal calculi, focal lesion, or hydronephrosis. Bladder is unremarkable. Stomach/Bowel: Stomach is within normal limits. Appendix appears normal. No evidence of bowel wall thickening, distention, or inflammatory changes. Vascular/Lymphatic: No significant vascular findings are present. No enlarged abdominal or pelvic lymph nodes. Reproductive: 22 mm well-circumscribed wall cyst with benign features. Other: No abdominal wall hernia  or abnormality. No abdominopelvic ascites. Musculoskeletal: Stable lumbar spondylosis greatest at L5-S1 level where disc and facet degenerative changes result in bilateral neural foraminal stenosis. No acute osseous abnormality identified. IMPRESSION: 1. No acute process identified. 2. Stable liver hemangiomas. 3. Hepatic steatosis. Electronically Signed   By: Mitzi Hansen M.D.   On: 08/03/2017 03:18   Dg Abd 2 Views  Result Date: 08/02/2017 CLINICAL DATA:  Nausea, vomiting and diarrhea. EXAM: ABDOMEN - 2 VIEW COMPARISON:  Body CT 07/25/2012 FINDINGS: Nonobstructive/nonspecific bowel gas pattern with relative paucity of small bowel gas. There is no evidence of free air. No radio-opaque calculi or other significant radiographic abnormality is seen. IMPRESSION: Nonobstructive/nonspecific bowel gas pattern with relative paucity of small bowel gas. Electronically Signed   By: Ted Mcalpine M.D.   On: 08/02/2017 23:36       Subjective: No further nausea vomiting or diarrhea. Has mild epigastric pain. Tolerating full liquid diet.  Discharge Exam: Vitals:   08/03/17 0308 08/03/17 0344  BP: (!) 91/59 112/65  Pulse: 83 71  Resp: 19 20  Temp: 98 F (36.7 C) 98.7 F (37.1 C)  SpO2: 98% 97%   Vitals:   08/03/17 0030 08/03/17 0100 08/03/17 0308 08/03/17 0344  BP: 104/73 113/66 (!) 91/59 112/65  Pulse: 93 85 83 71  Resp:   19 20  Temp:   98 F (36.7 C) 98.7 F (37.1 C)  TempSrc:    Oral  SpO2: 97% 95% 98% 97%  Weight:   108.9 kg (240 lb 1.3 oz) 108.7 kg (239 lb 11.2 oz)  Height:   5\' 7"  (1.702 m) 5\' 7"  (1.702 m)    General: Middle aged obese female not in distress   HEENT: No pallor, moist mucosa, supple neck  chest: Clear bilaterally  CVS: Normal S1 and S2, no murmurs GI: Soft, nondistended, Mild epigastric tenderness, bowel sounds present  Musculoskeletal: Warm, no edema   The results of significant diagnostics from this hospitalization (including imaging,  microbiology, ancillary and laboratory) are listed below for reference.     Microbiology: No results found for this or any previous visit (from the past 240 hour(s)).   Labs: BNP (last 3 results) No results for input(s): BNP in the last 8760 hours. Basic Metabolic Panel: Recent Labs  Lab 08/02/17 1830 08/03/17 0233  NA 139 140  K 4.2 4.6  CL 106 107  CO2 25 27  GLUCOSE 104* 140*  BUN 16 14  CREATININE 1.04* 0.93  CALCIUM 8.6* 8.8*   Liver Function Tests: Recent Labs  Lab 08/02/17 1830  AST 27  ALT 45  ALKPHOS 78  BILITOT 0.5  PROT 7.1  ALBUMIN 3.7   Recent Labs  Lab 08/02/17 1830  LIPASE 34   No results for input(s): AMMONIA in the last 168 hours. CBC: Recent Labs  Lab 08/02/17 1742 08/03/17 0233  WBC 12.6* 10.4  HGB 13.8 13.0  HCT 42.4 40.4  MCV 93.6 95.5  PLT 248 216   Cardiac Enzymes: No results for input(s): CKTOTAL, CKMB, CKMBINDEX, TROPONINI in the last 168 hours. BNP: Invalid input(s): POCBNP CBG: No results for input(s): GLUCAP in the last 168 hours. D-Dimer No results for input(s): DDIMER in the last 72 hours. Hgb A1c No results for input(s): HGBA1C in the last 72 hours. Lipid Profile No results for input(s): CHOL, HDL, LDLCALC, TRIG, CHOLHDL, LDLDIRECT in the last 72 hours. Thyroid function studies Recent Labs    08/02/17 1742  TSH 1.325   Anemia work up No results for input(s): VITAMINB12, FOLATE, FERRITIN, TIBC, IRON, RETICCTPCT in the last 72 hours. Urinalysis    Component Value Date/Time   COLORURINE YELLOW 08/02/2017 1950   APPEARANCEUR HAZY (A) 08/02/2017 1950   LABSPEC 1.017 08/02/2017 1950   PHURINE 5.0 08/02/2017 1950   GLUCOSEU NEGATIVE 08/02/2017 1950   HGBUR NEGATIVE 08/02/2017 1950   BILIRUBINUR NEGATIVE 08/02/2017 1950   KETONESUR NEGATIVE 08/02/2017 1950   PROTEINUR NEGATIVE 08/02/2017 1950   UROBILINOGEN 1.0 07/24/2012 0229   NITRITE NEGATIVE 08/02/2017 1950   LEUKOCYTESUR SMALL (A) 08/02/2017 1950    Sepsis Labs Invalid input(s): PROCALCITONIN,  WBC,  LACTICIDVEN Microbiology No results found for this or any previous visit (from the past 240 hour(s)).   Time coordinating discharge: <30 minutes  SIGNED:   Eddie NorthNishant Shetara Launer, MD  [facility] 08/03/2017, 12:08 PM Pager   If 7PM-7AM, please contact night-coverage [email or web address] Password [email or web address]

## 2017-08-04 LAB — HIV ANTIBODY (ROUTINE TESTING W REFLEX): HIV SCREEN 4TH GENERATION: NONREACTIVE

## 2017-08-30 ENCOUNTER — Ambulatory Visit: Payer: Self-pay | Admitting: Gastroenterology

## 2017-08-31 ENCOUNTER — Other Ambulatory Visit: Payer: Self-pay | Admitting: Physician Assistant

## 2017-09-01 NOTE — Telephone Encounter (Signed)
Last seen 01/04/17

## 2017-09-12 ENCOUNTER — Other Ambulatory Visit: Payer: Self-pay | Admitting: Physician Assistant

## 2017-09-12 DIAGNOSIS — K219 Gastro-esophageal reflux disease without esophagitis: Secondary | ICD-10-CM

## 2017-09-28 ENCOUNTER — Other Ambulatory Visit: Payer: Self-pay | Admitting: Physician Assistant

## 2017-09-28 DIAGNOSIS — F419 Anxiety disorder, unspecified: Secondary | ICD-10-CM

## 2017-10-12 ENCOUNTER — Other Ambulatory Visit: Payer: Self-pay | Admitting: Physician Assistant

## 2017-10-12 NOTE — Telephone Encounter (Signed)
What is the name of the medication? Xanax 0.5  Have you contacted your pharmacy to request a refill? YES  [facility] [place]   Patient notified that their request is being sent to the clinical staff for review and that they should receive a call once it is complete. If they do not receive a call within 24 hours they can check with their pharmacy or our office.

## 2017-10-12 NOTE — Telephone Encounter (Signed)
Pt aware that appt is needed.

## 2017-10-15 ENCOUNTER — Emergency Department (HOSPITAL_COMMUNITY): Payer: Self-pay

## 2017-10-15 ENCOUNTER — Emergency Department (HOSPITAL_COMMUNITY)
Admission: EM | Admit: 2017-10-15 | Discharge: 2017-10-15 | Disposition: A | Discharge status: Home / Self Care | Payer: Self-pay | Attending: Emergency Medicine | Admitting: Emergency Medicine

## 2017-10-15 ENCOUNTER — Other Ambulatory Visit: Payer: Self-pay

## 2017-10-15 ENCOUNTER — Encounter (HOSPITAL_COMMUNITY): Payer: Self-pay

## 2017-10-15 DIAGNOSIS — Z79899 Other long term (current) drug therapy: Secondary | ICD-10-CM | POA: Insufficient documentation

## 2017-10-15 DIAGNOSIS — Z87891 Personal history of nicotine dependence: Secondary | ICD-10-CM | POA: Insufficient documentation

## 2017-10-15 DIAGNOSIS — I10 Essential (primary) hypertension: Secondary | ICD-10-CM | POA: Insufficient documentation

## 2017-10-15 DIAGNOSIS — J4541 Moderate persistent asthma with (acute) exacerbation: Secondary | ICD-10-CM | POA: Insufficient documentation

## 2017-10-15 DIAGNOSIS — F419 Anxiety disorder, unspecified: Secondary | ICD-10-CM | POA: Insufficient documentation

## 2017-10-15 HISTORY — DX: Unspecified asthma, uncomplicated: J45.909

## 2017-10-15 LAB — CBC WITH DIFFERENTIAL/PLATELET
BASOS PCT: 1 %
Basophils Absolute: 0 10*3/uL (ref 0.0–0.1)
EOS ABS: 0.5 10*3/uL (ref 0.0–0.7)
EOS PCT: 7 %
HCT: 43.1 % (ref 36.0–46.0)
Hemoglobin: 14 g/dL (ref 12.0–15.0)
LYMPHS ABS: 2.9 10*3/uL (ref 0.7–4.0)
Lymphocytes Relative: 40 %
MCH: 30.8 pg (ref 26.0–34.0)
MCHC: 32.5 g/dL (ref 30.0–36.0)
MCV: 94.9 fL (ref 78.0–100.0)
MONO ABS: 0.4 10*3/uL (ref 0.1–1.0)
Monocytes Relative: 5 %
Neutro Abs: 3.5 10*3/uL (ref 1.7–7.7)
Neutrophils Relative %: 47 %
Platelets: 240 10*3/uL (ref 150–400)
RBC: 4.54 MIL/uL (ref 3.87–5.11)
RDW: 12.8 % (ref 11.5–15.5)
WBC: 7.3 10*3/uL (ref 4.0–10.5)

## 2017-10-15 LAB — BASIC METABOLIC PANEL
ANION GAP: 8 (ref 5–15)
BUN: 12 mg/dL (ref 6–20)
CHLORIDE: 105 mmol/L (ref 101–111)
CO2: 26 mmol/L (ref 22–32)
Calcium: 9.4 mg/dL (ref 8.9–10.3)
Creatinine, Ser: 0.78 mg/dL (ref 0.44–1.00)
GFR calc Af Amer: >60 mL/min (ref >60)
Glucose, Bld: 100 mg/dL — ABNORMAL HIGH (ref 65–99)
Potassium: 4.9 mmol/L (ref 3.5–5.1)
SODIUM: 139 mmol/L (ref 135–145)

## 2017-10-15 LAB — ACETAMINOPHEN LEVEL: Acetaminophen (Tylenol), Serum: <10 ug/mL — ABNORMAL LOW (ref 10–30)

## 2017-10-15 MED ORDER — PREDNISONE 10 MG PO TABS: ORAL_TABLET | ORAL | 0 refills | Status: DC

## 2017-10-15 MED ORDER — PREDNISONE 50 MG PO TABS
60.0000 mg | ORAL_TABLET | Freq: Once | ORAL | Status: AC
  Administered 2017-10-15: 60 mg via ORAL
  Filled 2017-10-15: qty 1

## 2017-10-15 MED ORDER — ALBUTEROL (5 MG/ML) CONTINUOUS INHALATION SOLN
10.0000 mg/h | INHALATION_SOLUTION | Freq: Once | RESPIRATORY_TRACT | Status: AC
  Administered 2017-10-15: 10 mg/h via RESPIRATORY_TRACT
  Filled 2017-10-15: qty 20

## 2017-10-15 NOTE — Discharge Instructions (Signed)
Use your nebulizer 1-2 doses, every 4-6 hours as needed for shortness of breath or trouble breathing.  Start the prednisone prescription tomorrow morning.  Follow-up with the pulmonologist as soon as possible for further evaluation and treatment, with addition of medication which can improve your symptoms, and decrease the recurrent episodes of bronchospasm.

## 2017-10-15 NOTE — ED Provider Notes (Signed)
[facility] EMERGENCY DEPARTMENT Provider Note   CSN: [id number] Arrival date & time: 10/15/17  0865     History   Chief Complaint Chief Complaint  Patient presents with  . Cough    HPI Elizabeth Krueger is a 52 y.o. female.  She presents for evaluation of cough for 4 weeks.  She is using her patient's including inhalers and nebulizers, and over-the-counter products without relief.  Complains of chest pain on inspiration.  She has cough without sputum production.  She has dyspnea both day and night which may be worse at night according to her husband.  She does not smoke.  She has been seen by pulmonary, about 2 years ago, was started on CPAP for sleep apnea, and has not followed up since then.  She could not afford Qvar which was prescribed and did not tolerate another inhaled medication because of soreness in her mouth.  She is frustrated that she does not have an answer to this problem because she gets recurrent symptoms like this every few months.  It seems to happen "when the weather changes."  She has trouble working, her job, as a Hospital doctor," in a warehouse.  There are no other known modifying factors.  HPI  Past Medical History:  Diagnosis Date  . Asthma   . Facial tic   . GERD (gastroesophageal reflux disease)   . IBS (irritable bowel syndrome)     Patient Active Problem List   Diagnosis Date Noted  . Nausea vomiting and diarrhea 08/03/2017  . N&V (nausea and vomiting) 08/03/2017  . Gastroesophageal reflux disease without esophagitis 01/06/2017  . Tremor 01/06/2017  . Anxiety 01/06/2017  . Mild intermittent asthma 01/06/2017  . Essential hypertension 01/06/2017  . Sleep apnea 01/06/2017    Past Surgical History:  Procedure Laterality Date  . CESAREAN SECTION      OB History    No data available       Home Medications    Prior to Admission medications   Medication Sig Start Date End Date Taking? Authorizing Provider  albuterol (PROVENTIL) (2.5  MG/3ML) 0.083% nebulizer solution Take 2.5 mg by nebulization every 6 (six) hours as needed for wheezing or shortness of breath.    [provider]  citalopram (CELEXA) 40 MG tablet TAKE ONE (1) TABLET EACH DAY 01/04/17   Remus Loffler, PA-C  hydrochlorothiazide (HYDRODIURIL) 25 MG tablet Take 1 tablet (25 mg total) by mouth daily. Patient taking differently: Take 25 mg by mouth daily as needed (FOR FLUID).  01/04/17   Remus Loffler, PA-C  lansoprazole (PREVACID) 30 MG capsule TAKE ONE CAPSULE BY MOUTH TWICE A DAY 09/12/17   Remus Loffler, PA-C  metoCLOPramide (REGLAN) 5 MG tablet Take 1 tablet (5 mg total) by mouth every 8 (eight) hours as needed for nausea (hiccups). 08/03/17 08/03/18  Dhungel, Theda Belfast, MD  metoprolol succinate (TOPROL-XL) 100 MG 24 hr tablet Take 1 tablet (100 mg total) by mouth daily. Take with or immediately following a meal. 01/04/17   Remus Loffler, PA-C  montelukast (SINGULAIR) 10 MG tablet Take 1 tablet (10 mg total) by mouth every evening. 01/04/17   Remus Loffler, PA-C  Multiple Vitamin (MULTIVITAMIN WITH MINERALS) TABS tablet Take 1 tablet by mouth every evening.    [provider]  PROAIR HFA 108 [phone] Base) MCG/ACT inhaler INHALE 2 PUFFS EVERY 4 HOURS AS NEEDED 09/01/17   Remus Loffler, PA-C    Family History Family History  Problem Relation Age  of Onset  . Prostate cancer Father   . Diabetes Father   . Heart disease Father   . COPD Father   . Rheum arthritis Father   . Diabetes Mother   . Stomach cancer Maternal Grandmother   . Liver cancer Paternal Grandfather     Social History Social History   Tobacco Use  . Smoking status: Former Smoker    Packs/day: 0.10    Years: 2.00    Pack years: 0.20    Types: Pipe, Cigarettes    Last attempt to quit: 08/29/2000    Years since quitting: 17.1  . Smokeless tobacco: Never Used  Substance Use Topics  . Alcohol use: No    Alcohol/week: 0.0 oz  . Drug use: No     Allergies   Patient has no known  allergies.   Review of Systems Review of Systems  All other systems reviewed and are negative.    Physical Exam Updated Vital Signs BP (!) 148/82 (BP Location: Right Arm)   Pulse 78   Temp 98.4 F (36.9 C) (Oral)   Resp 18   Ht 5\' 7"  (1.702 m)   Wt 103 kg (227 lb)   LMP 10/08/2017   SpO2 98%   BMI 35.55 kg/m   Physical Exam  Constitutional: She is oriented to person, place, and time. She appears well-developed. She appears distressed (Uncomfortable).  Overweight, appears older than stated age.  HENT:  Head: Normocephalic and atraumatic.  Eyes: Conjunctivae and EOM are normal. Pupils are equal, round, and reactive to light.  Neck: Normal range of motion and phonation normal. Neck supple.  Cardiovascular: Normal rate and regular rhythm.  Pulmonary/Chest: Effort normal. No stridor. No respiratory distress. She has no rales. She exhibits no tenderness.  Decreased air movement bilaterally with generalized wheezing.  Abdominal: Soft. She exhibits no distension. There is no tenderness. There is no guarding.  Musculoskeletal: Normal range of motion. She exhibits no edema or deformity.  Neurological: She is alert and oriented to person, place, and time. She exhibits normal muscle tone.  Face and upper neck tic for present.  No dysarthria or aphasia.  Skin: Skin is warm and dry.  Psychiatric: She has a normal mood and affect. Her behavior is normal. Judgment and thought content normal.  Nursing note and vitals reviewed.    ED Treatments / Results  Labs (all labs ordered are listed, but only abnormal results are displayed) Labs Reviewed - No data to display  EKG  EKG Interpretation None       Radiology Dg Chest 2 View  Result Date: 10/15/2017 CLINICAL DATA:  Cough 4 weeks and history of asthma. EXAM: CHEST  2 VIEW COMPARISON:  07/24/2012 FINDINGS: Lungs are adequately inflated without focal airspace consolidation or effusion. Cardiomediastinal silhouette and remainder  of the exam is unchanged. IMPRESSION: No active cardiopulmonary disease. Electronically Signed   By: Elberta Fortis M.D.   On: 10/15/2017 10:59    Procedures Procedures (including critical care time)  Medications Ordered in ED Medications - No data to display   Initial Impression / Assessment and Plan / ED Course  I have reviewed the triage vital signs and the nursing notes.  Pertinent labs & imaging results that were available during my care of the patient were reviewed by me and considered in my medical decision making (see chart for details).  Clinical Course as of Oct 15 1717  Wynelle Link Oct 15, 2017  1709 Acetaminophen (Tylenol), S: (!) <10 [EW]  1716 Acetaminophen (  Tylenol), S: (!) <10 [EW]  1717 WBC: 7.3 [EW]    Clinical Course User Index [EW] Mancel BaleWentz, Fay Bagg, MD     Patient Vitals for the past 24 hrs:  BP Temp Temp src Pulse Resp SpO2 Height Weight  10/15/17 1300 129/90 - - 74 18 98 % - -  10/15/17 1041 - - - - - - 5\' 7"  (1.702 m) 103 kg (227 lb)  10/15/17 1040 (!) 148/82 98.4 F (36.9 C) Oral 78 18 98 % - -  10/15/17 1039 - - - - - 97 % - -    5:09 PM Reevaluation with update and discussion. After initial assessment and treatment, an updated evaluation reveals she reports she feels much better after the nebulizer.  Air movement has improved and wheezing has decreased.  Findings discussed with patient and family member, all questions answered. Mancel BaleElliott Gordie Belvin      Final Clinical Impressions(s) / ED Diagnoses   Final diagnoses:  Moderate persistent asthma with exacerbation     Nursing Notes Reviewed/ Care Coordinated Applicable Imaging Reviewed Interpretation of Laboratory Data incorporated into ED treatment  The patient appears reasonably screened and/or stabilized for discharge and I doubt any other medical condition or other [facility] requiring further screening, evaluation, or treatment in the ED at this time prior to discharge.  Plan: Home Medications-increase  frequency and strength of nebulizer treatment at home; Home Treatments-rest, fluids, light duty for 1 week; return here if the recommended treatment, does not improve the symptoms; Recommended follow up-PCP and pulmonary follow-up  ED Discharge Orders    None       Mancel BaleWentz, Arshia Rondon, MD 10/15/17 1722

## 2017-10-15 NOTE — ED Triage Notes (Signed)
Pt reports nonproductive cough x 4 weeks.  Has history of asthma and has been using inhalers and nebs without relief.  Has been taking mucinex otc.  C/O chest hurts when she coughs or takes a deep breath.

## 2017-10-15 NOTE — ED Notes (Signed)
Respiratory paged at this time for tx.  

## 2017-10-15 NOTE — ED Notes (Signed)
Pt resting with eyes closed while receiving hour long nebulizer.

## 2017-10-26 ENCOUNTER — Other Ambulatory Visit: Payer: Self-pay | Admitting: Physician Assistant

## 2017-10-26 DIAGNOSIS — K219 Gastro-esophageal reflux disease without esophagitis: Secondary | ICD-10-CM

## 2017-10-26 DIAGNOSIS — J4520 Mild intermittent asthma, uncomplicated: Secondary | ICD-10-CM

## 2017-10-30 ENCOUNTER — Other Ambulatory Visit: Payer: Self-pay | Admitting: Physician Assistant

## 2017-10-30 DIAGNOSIS — J4520 Mild intermittent asthma, uncomplicated: Secondary | ICD-10-CM

## 2017-12-08 ENCOUNTER — Other Ambulatory Visit: Payer: Self-pay | Admitting: Physician Assistant

## 2017-12-08 DIAGNOSIS — I10 Essential (primary) hypertension: Secondary | ICD-10-CM

## 2017-12-08 NOTE — Telephone Encounter (Signed)
Last seen 01/04/17  Elizabeth KobusAngel

## 2017-12-30 ENCOUNTER — Telehealth: Payer: Self-pay | Admitting: Physician Assistant

## 2017-12-30 ENCOUNTER — Other Ambulatory Visit: Payer: Self-pay | Admitting: Physician Assistant

## 2017-12-30 DIAGNOSIS — J4520 Mild intermittent asthma, uncomplicated: Secondary | ICD-10-CM

## 2017-12-30 DIAGNOSIS — I10 Essential (primary) hypertension: Secondary | ICD-10-CM

## 2017-12-30 NOTE — Telephone Encounter (Signed)
[facility] aware, patient needs appointment . Last seen in 2018.

## 2017-12-30 NOTE — Telephone Encounter (Signed)
Patient was advised she needed an appointment and current labs at our facility to get refills.   She was very upset and  says she intends to go elsewhere.  She says [facility] has never given Ms Elizabeth Krueger messages and they had an agreement that she would not have any problems if she became a patient here.  Advised that note would be given to provider.

## 2018-01-01 ENCOUNTER — Other Ambulatory Visit: Payer: Self-pay | Admitting: Physician Assistant

## 2018-01-01 MED ORDER — PREDNISONE 10 MG (21) PO TBPK: ORAL_TABLET | ORAL | 0 refills | Status: DC

## 2018-01-01 MED ORDER — AZITHROMYCIN 250 MG PO TABS: ORAL_TABLET | ORAL | 0 refills | Status: DC

## 2018-01-01 MED ORDER — ALPRAZOLAM 0.5 MG PO TABS: 0.2500 mg | ORAL_TABLET | Freq: Every evening | ORAL | 0 refills | Status: DC | PRN

## 2018-01-01 NOTE — Telephone Encounter (Signed)
Noted, patient to come for appointment

## 2018-01-09 ENCOUNTER — Telehealth: Payer: Self-pay

## 2018-01-09 ENCOUNTER — Other Ambulatory Visit: Payer: Self-pay | Admitting: Physician Assistant

## 2018-01-09 ENCOUNTER — Telehealth: Payer: Self-pay | Admitting: Gastroenterology

## 2018-01-09 DIAGNOSIS — R112 Nausea with vomiting, unspecified: Secondary | ICD-10-CM

## 2018-01-09 DIAGNOSIS — K219 Gastro-esophageal reflux disease without esophagitis: Secondary | ICD-10-CM

## 2018-01-09 NOTE — Telephone Encounter (Signed)
Please use hospital f/u in which we saw the patient as an inpatient

## 2018-01-09 NOTE — Telephone Encounter (Signed)
Called patient and they are scheduled for this Thursday

## 2018-01-09 NOTE — Telephone Encounter (Signed)
PATIENT CALLED THIS MORNING TO RESCHEDULE AN APPOINTMENT FROM January.  THE PATIENT WAS SEEN AT [facility] IN December AND CANCELLED THE APPOINTMENT THEY HAD SCHEDULED FOR January. PATIENT STATED THEY WERE IN WITH [facility].  DO THEY NEED TO SEE THAT OFFICE OR CAN WE USE THE REFERRAL FROM THE HOSPITAL SINCE IT WAS FROM December AND IT IS NOW MAY?

## 2018-01-09 NOTE — Telephone Encounter (Signed)
Referral placed for GI [place]

## 2018-01-09 NOTE — Telephone Encounter (Signed)
Said you had given her a referral to GI but never went because of sickness in family  Wants another one  Still having issues  Wants [place]

## 2018-01-11 ENCOUNTER — Telehealth: Payer: Self-pay

## 2018-01-11 ENCOUNTER — Ambulatory Visit (INDEPENDENT_AMBULATORY_CARE_PROVIDER_SITE_OTHER): Payer: Self-pay | Admitting: Nurse Practitioner

## 2018-01-11 ENCOUNTER — Encounter: Payer: Self-pay | Admitting: Nurse Practitioner

## 2018-01-11 ENCOUNTER — Other Ambulatory Visit: Payer: Self-pay

## 2018-01-11 VITALS — BP 133/93 | HR 88 | Temp 97.2°F | Ht 67.0 in | Wt 232.6 lb

## 2018-01-11 DIAGNOSIS — R112 Nausea with vomiting, unspecified: Secondary | ICD-10-CM

## 2018-01-11 DIAGNOSIS — K219 Gastro-esophageal reflux disease without esophagitis: Secondary | ICD-10-CM

## 2018-01-11 DIAGNOSIS — R197 Diarrhea, unspecified: Secondary | ICD-10-CM | POA: Insufficient documentation

## 2018-01-11 NOTE — Telephone Encounter (Signed)
Called and informed pt of pre-op appt 03/02/18 at 11:00am. Letter mailed.

## 2018-01-11 NOTE — Progress Notes (Signed)
Referring Provider: Remus Loffler, PA-C Primary Care Physician:  Remus Loffler, PA-C Primary GI:  Dr. Jena Gauss  Chief Complaint  Patient presents with  . Gastroesophageal Reflux    "burping has a terrible smell that can't be explained", tries to eat before 6pm daiy but still has problems during the night  . Emesis    "smells like diarrhea"  . Diarrhea    episodes 1-2 times per month    HPI:   Elizabeth Krueger is a 52 y.o. female who presents for GERD, diarrhea, emesis.  This is a post hospitalization follow-up for hospitalization in December 2018 where in the patient had to cancel her previously scheduled hospital follow-up appointment.  She was admitted to the hospital from 08/02/2017 through 08/03/2017.  Noted history of IBS and GERD who presented with nausea, vomiting, diarrhea for the previous few days as well as epigastric pain.  She was on Protonix twice daily at that time.  No previous GI work-up.  Symptoms worse with stress.  3 rounds of antiemetics in the ED without relief and one episode of vomiting in the ED.  Nausea and vomiting with diarrhea likely secondary to acute gastroenteritis versus severe GERD.  Recommend continue PPI, avoid NSAIDs, take Reglan as needed for intermittent hiccups and nausea.  Also likely significant GERD with possible gastritis with ongoing symptoms for a few years without improvement despite increasing PPI to twice daily.  No previous GI work-up.  CT the abdomen and pelvis completed 08/03/2017 during hospitalization found no acute process, stable liver hemangiomas, hepatic steatosis.  Most recent labs dated 10/15/2017 found normal CBC, essentially normal BMP.  Today she states she is having terrible GERD with N/V, progressively worse as she used to be able to handle it but not anymore. Saw [facility] in 2013 with "testing for the IBS but they couldn't find anything." Has heartburn/GERD symptoms about 4-5 times a week; using a wedge which helped some; if she lays  flat almost instant recurrence. Has throat burning, epigastric pain, bitter taste. N/V often triggered by these symptoms. Having loose with fecal urgency, bowel movement 2-3 times a day; not watery. Denies hematochezia, melena, fever, chills, unintentional weight loss, sick contacts, dietary changes, recent travel. Occasionally has abdominal pain with loose stools but not always. Has chronic loose stools. Her N/V is triggered by passing gas and feels like "I'm vomiting pure fecal matter" and has a "foul odor." Denies chest pain, dyspnea, dizziness, lightheadedness, syncope, near syncope. Denies any other upper or lower GI symptoms.  Lansoprazole twice daily somewhat helpful but not great. If she misses a dose will have worse symptoms.  Past Medical History:  Diagnosis Date  . Asthma   . Facial tic   . GERD (gastroesophageal reflux disease)   . IBS (irritable bowel syndrome)     Past Surgical History:  Procedure Laterality Date  . CESAREAN SECTION      Current Outpatient Medications  Medication Sig Dispense Refill  . albuterol (PROVENTIL) (2.5 MG/3ML) 0.083% nebulizer solution Take 2.5 mg by nebulization every 6 (six) hours as needed for wheezing or shortness of breath.    . ALPRAZolam (XANAX) 0.5 MG tablet Take 0.5 tablets (0.25 mg total) by mouth at bedtime as needed for anxiety. 60 tablet 0  . guaiFENesin (MUCINEX) 600 MG 12 hr tablet Take 600 mg by mouth as needed.     . hydrochlorothiazide (HYDRODIURIL) 25 MG tablet TAKE ONE (1) TABLET EACH DAY (Patient taking differently: TAKE ONE (1) TABLET  EACH DAY AS NEEDED) 90 tablet 0  . lansoprazole (PREVACID) 30 MG capsule TAKE ONE CAPSULE BY MOUTH TWICE A DAY 60 capsule 0  . metoCLOPramide (REGLAN) 5 MG tablet Take 1 tablet (5 mg total) by mouth every 8 (eight) hours as needed for nausea (hiccups). 15 tablet 0  . metoprolol succinate (TOPROL-XL) 100 MG 24 hr tablet TAKE ONE (1) TABLET EACH DAY 90 tablet 0  . montelukast (SINGULAIR) 10 MG tablet  TAKE 1 TABLET DAILY IN THE EVENING 90 tablet 0  . PROAIR HFA 108 (90 Base) MCG/ACT inhaler INHALE 2 PUFFS EVERY 4 HOURS AS NEEDED 8.5 g 0   No current facility-administered medications for this visit.     Allergies as of 01/11/2018  . (No Known Allergies)    Family History  Problem Relation Age of Onset  . Prostate cancer Father   . Diabetes Father   . Heart disease Father   . COPD Father   . Rheum arthritis Father   . Diabetes Mother   . Stomach cancer Maternal Grandmother   . Liver cancer Paternal Grandfather   . Colon cancer Neg Hx   . Esophageal cancer Neg Hx     Social History   Socioeconomic History  . Marital status: Married    Spouse name: Not on file  . Number of children: Not on file  . Years of education: Not on file  . Highest education level: Not on file  Occupational History  . Not on file  Social Needs  . Financial resource strain: Not on file  . Food insecurity:    Worry: Not on file    Inability: Not on file  . Transportation needs:    Medical: Not on file    Non-medical: Not on file  Tobacco Use  . Smoking status: Former Smoker    Packs/day: 0.10    Years: 2.00    Pack years: 0.20    Types: Pipe, Cigarettes    Last attempt to quit: 08/29/2000    Years since quitting: 17.3  . Smokeless tobacco: Never Used  Substance and Sexual Activity  . Alcohol use: No    Alcohol/week: 0.0 oz  . Drug use: No  . Sexual activity: Not on file  Lifestyle  . Physical activity:    Days per week: Not on file    Minutes per session: Not on file  . Stress: Not on file  Relationships  . Social connections:    Talks on phone: Not on file    Gets together: Not on file    Attends religious service: Not on file    Active member of club or organization: Not on file    Attends meetings of clubs or organizations: Not on file    Relationship status: Not on file  Other Topics Concern  . Not on file  Social History Narrative   Married, lives with spouse   1 child     3 cans of caffeine daily   Customer Service Coordinator    Review of Systems: General: Negative for anorexia, weight loss, fever, chills, fatigue, weakness. ENT: Negative for hoarseness, difficulty swallowing. CV: Negative for chest pain, angina, palpitations, peripheral edema.  Respiratory: Negative for dyspnea at rest, cough, sputum, wheezing.  GI: See history of present illness. MS: Negative for joint pain, low back pain.  Derm: Negative for rash or itching.  Endo: Negative for unusual weight change.  Heme: Negative for bruising or bleeding. Allergy: Negative for rash or hives.  Physical Exam: BP (!) 133/93   Pulse 88   Temp (!) 97.2 F (36.2 C) (Oral)   Ht  (1.702 m)   Wt 232 lb 9.6 oz (105.5 kg)   LMP 12/28/2017   BMI 36.43 kg/m  General:   Alert and oriented. Pleasant and cooperative. Well-nourished and well-developed.  Eyes:  Without icterus, sclera clear and conjunctiva pink.  Ears:  Normal auditory acuity. Cardiovascular:  S1, S2 present without murmurs appreciated. Extremities without clubbing or edema. Respiratory:  Clear to auscultation bilaterally. No wheezes, rales, or rhonchi. No distress.  Gastrointestinal:  +BS, soft, and non-distended. Mild abdominal TTP. No HSM noted. No guarding or rebound. No masses appreciated.  Rectal:  Deferred  Musculoskalatal:  Symmetrical without gross deformities. Neurologic:  Alert and oriented x4;  grossly normal neurologically. Psych:  Alert and cooperative. Normal mood and affect. Heme/Lymph/Immune: No excessive bruising noted.    01/11/2018 8:56 AM   Disclaimer: This note was dictated with voice recognition software. Similar sounding words can inadvertently be transcribed and may not be corrected upon review.

## 2018-01-11 NOTE — Assessment & Plan Note (Signed)
The patient states she is not really having diarrhea, which she considers to be watery.  She is having loose stools which are generally not watery.  This is a chronic problem for her.  This is not an acute change.  Occasionally abdominal pain is associated with her diarrhea but not always.  She is due for a screening colonoscopy.  At this point given her other, more severe upper GI symptoms we will focus on this.  We will bring her back in 3 months and at that time we can discuss routine screening colonoscopy versus diagnostic colonoscopy for colon cancer screening and chronic diarrhea.  She has been diagnosed with IBS in the past.  She may just need IBS management.  Follow-up in 3 months.

## 2018-01-11 NOTE — Assessment & Plan Note (Signed)
Persist with GERD symptoms despite twice daily lansoprazole.  Her symptoms are a bit less frequent but typically happens a few times a week.  I will stop lansoprazole and start her on Dexilant with samples for 1 to 2 weeks.  Request progress report in 1 to 2 weeks.  Her GERD symptoms are persistent despite treatment.  Also with associated nausea and vomiting.  This point we will plan for an upper endoscopy to further evaluate.  Of note she is due for screening colonoscopy and this can be arranged at a later date.  Proceed with EGD on propofol/MAC with Dr. Gala Romney in near future: the risks, benefits, and alternatives have been discussed with the patient in detail. The patient states understanding and desires to proceed.  The patient is currently on Xanax.  She seems quite anxious.  No other anticoagulants, anxiolytics, chronic pain medications, or antidepressants.  Due to sedating medication and anxiety we will plan for the procedure on propofol/MAC to promote adequate sedation.

## 2018-01-11 NOTE — Patient Instructions (Addendum)
1. Stop taking lansoprazole for now. 2. We are giving you samples of Dexilant to last 1 to 2 weeks. 3. Call us in 1 to 2 weeks and let us know if the Dexilant is helping your reflux any better. 4. Below is information on reflux and things to help. 5. We will schedule your upper endoscopy for you. 6. Return for follow-up in 3 months. 7. Call us if you have any questions or concerns.  At [facility] we value your feedback. You may receive a survey about your visit today. Please share your experience as we strive to create trusting relationships with our patients to provide genuine, compassionate, quality care.  It was great to meet you today!  I hope you have a wonderful summer!!      Gastroesophageal Reflux Disease, Adult Normally, food travels down the esophagus and stays in the stomach to be digested. However, when a person has gastroesophageal reflux disease (GERD), food and stomach acid move back up into the esophagus. When this happens, the esophagus becomes sore and inflamed. Over time, GERD can create small holes (ulcers) in the lining of the esophagus. What are the causes? This condition is caused by a problem with the muscle between the esophagus and the stomach (lower esophageal sphincter, or LES). Normally, the LES muscle closes after food passes through the esophagus to the stomach. When the LES is weakened or abnormal, it does not close properly, and that allows food and stomach acid to go back up into the esophagus. The LES can be weakened by certain dietary substances, medicines, and medical conditions, including:  Tobacco use.  Pregnancy.  Having a hiatal hernia.  Heavy alcohol use.  Certain foods and beverages, such as coffee, chocolate, onions, and peppermint.  What increases the risk? This condition is more likely to develop in:  People who have an increased body weight.  People who have connective tissue disorders.  People who use NSAID  medicines.  What are the signs or symptoms? Symptoms of this condition include:  Heartburn.  Difficult or painful swallowing.  The feeling of having a lump in the throat.  Abitter taste in the mouth.  Bad breath.  Having a large amount of saliva.  Having an upset or bloated stomach.  Belching.  Chest pain.  Shortness of breath or wheezing.  Ongoing (chronic) cough or a night-time cough.  Wearing away of tooth enamel.  Weight loss.  Different conditions can cause chest pain. Make sure to see your health care provider if you experience chest pain. How is this diagnosed? Your health care provider will take a medical history and perform a physical exam. To determine if you have mild or severe GERD, your health care provider may also monitor how you respond to treatment. You may also have other tests, including:  An endoscopy toexamine your stomach and esophagus with a small camera.  A test thatmeasures the acidity level in your esophagus.  A test thatmeasures how much pressure is on your esophagus.  A barium swallow or modified barium swallow to show the shape, size, and functioning of your esophagus.  How is this treated? The goal of treatment is to help relieve your symptoms and to prevent complications. Treatment for this condition may vary depending on how severe your symptoms are. Your health care provider may recommend:  Changes to your diet.  Medicine.  Surgery.  Follow these instructions at home: Diet  Follow a diet as recommended by your health care provider. This may involve  avoiding foods and drinks such as: ? Coffee and tea (with or without caffeine). ? Drinks that containalcohol. ? Energy drinks and sports drinks. ? Carbonated drinks or sodas. ? Chocolate and cocoa. ? Peppermint and mint flavorings. ? Garlic and onions. ? Horseradish. ? Spicy and acidic foods, including peppers, chili powder, curry powder, vinegar, hot sauces, and barbecue  sauce. ? Citrus fruit juices and citrus fruits, such as oranges, lemons, and limes. ? Tomato-based foods, such as red sauce, chili, salsa, and pizza with red sauce. ? Fried and fatty foods, such as donuts, french fries, potato chips, and high-fat dressings. ? High-fat meats, such as hot dogs and fatty cuts of red and white meats, such as rib eye steak, sausage, ham, and bacon. ? High-fat dairy items, such as whole milk, butter, and cream cheese.  Eat small, frequent meals instead of large meals.  Avoid drinking large amounts of liquid with your meals.  Avoid eating meals during the 2-3 hours before bedtime.  Avoid lying down right after you eat.  Do not exercise right after you eat. General instructions  Pay attention to any changes in your symptoms.  Take over-the-counter and prescription medicines only as told by your health care provider. Do not take aspirin, ibuprofen, or other NSAIDs unless your health care provider told you to do so.  Do not use any tobacco products, including cigarettes, chewing tobacco, and e-cigarettes. If you need help quitting, ask your health care provider.  Wear loose-fitting clothing. Do not wear anything tight around your waist that causes pressure on your abdomen.  Raise (elevate) the head of your bed 6 inches (15cm).  Try to reduce your stress, such as with yoga or meditation. If you need help reducing stress, ask your health care provider.  If you are overweight, reduce your weight to an amount that is healthy for you. Ask your health care provider for guidance about a safe weight loss goal.  Keep all follow-up visits as told by your health care provider. This is important. Contact a health care provider if:  You have new symptoms.  You have unexplained weight loss.  You have difficulty swallowing, or it hurts to swallow.  You have wheezing or a persistent cough.  Your symptoms do not improve with treatment.  You have a hoarse  voice. Get help right away if:  You have pain in your arms, neck, jaw, teeth, or back.  You feel sweaty, dizzy, or light-headed.  You have chest pain or shortness of breath.  You vomit and your vomit looks like blood or coffee grounds.  You faint.  Your stool is bloody or black.  You cannot swallow, drink, or eat. This information is not intended to replace advice given to you by your health care provider. Make sure you discuss any questions you have with your health care provider. Document Released: 05/25/2005 Document Revised: 01/13/2016 Document Reviewed: 12/10/2014 Elsevier Interactive Patient Education  2018 [employer].     Food Choices for Gastroesophageal Reflux Disease, Adult When you have gastroesophageal reflux disease (GERD), the foods you eat and your eating habits are very important. Choosing the right foods can help ease the discomfort of GERD. Consider working with a diet and nutrition specialist (dietitian) to help you make healthy food choices. What general guidelines should I follow? Eating plan  Choose healthy foods low in fat, such as fruits, vegetables, whole grains, low-fat dairy products, and lean meat, fish, and poultry.  Eat frequent, small meals instead of three large  meals each day. Eat your meals slowly, in a relaxed setting. Avoid bending over or lying down until 2-3 hours after eating.  Limit high-fat foods such as fatty meats or fried foods.  Limit your intake of oils, butter, and shortening to less than 8 teaspoons each day.  Avoid the following: ? Foods that cause symptoms. These may be different for different people. Keep a food diary to keep track of foods that cause symptoms. ? Alcohol. ? Drinking large amounts of liquid with meals. ? Eating meals during the 2-3 hours before bed.  Cook foods using methods other than frying. This may include baking, grilling, or broiling. Lifestyle   Maintain a healthy weight. Ask your health care  provider what weight is healthy for you. If you need to lose weight, work with your health care provider to do so safely.  Exercise for at least 30 minutes on 5 or more days each week, or as told by your health care provider.  Avoid wearing clothes that fit tightly around your waist and chest.  Do not use any products that contain nicotine or tobacco, such as cigarettes and e-cigarettes. If you need help quitting, ask your health care provider.  Sleep with the head of your bed raised. Use a wedge under the mattress or blocks under the bed frame to raise the head of the bed. What foods are not recommended? The items listed may not be a complete list. Talk with your dietitian about what dietary choices are best for you. Grains Pastries or quick breads with added fat. [place] toast. Vegetables Deep fried vegetables. [place] fries. Any vegetables prepared with added fat. Any vegetables that cause symptoms. For some people this may include tomatoes and tomato products, chili peppers, onions and garlic, and horseradish. Fruits Any fruits prepared with added fat. Any fruits that cause symptoms. For some people this may include citrus fruits, such as oranges, grapefruit, pineapple, and lemons. Meats and other protein foods High-fat meats, such as fatty beef or pork, hot dogs, ribs, ham, sausage, salami and bacon. Fried meat or protein, including fried fish and fried chicken. Nuts and nut butters. Dairy Whole milk and chocolate milk. Sour cream. Cream. Ice cream. Cream cheese. Milk shakes. Beverages Coffee and tea, with or without caffeine. Carbonated beverages. Sodas. Energy drinks. Fruit juice made with acidic fruits (such as orange or grapefruit). Tomato juice. Alcoholic drinks. Fats and oils Butter. Margarine. Shortening. Ghee. Sweets and desserts Chocolate and cocoa. Donuts. Seasoning and other foods Pepper. Peppermint and spearmint. Any condiments, herbs, or seasonings that cause symptoms. For  some people, this may include curry, hot sauce, or vinegar-based salad dressings. Summary  When you have gastroesophageal reflux disease (GERD), food and lifestyle choices are very important to help ease the discomfort of GERD.  Eat frequent, small meals instead of three large meals each day. Eat your meals slowly, in a relaxed setting. Avoid bending over or lying down until 2-3 hours after eating.  Limit high-fat foods such as fatty meat or fried foods. This information is not intended to replace advice given to you by your health care provider. Make sure you discuss any questions you have with your health care provider. Document Released: 08/15/2005 Document Revised: 08/16/2016 Document Reviewed: 08/16/2016 Elsevier Interactive Patient Education  [employer].

## 2018-01-11 NOTE — Assessment & Plan Note (Signed)
The patient describes nausea and vomiting which is typically triggered by belching.  States when she vomits it is quite foul smelling.  No significant abdominal pain and subsequently I doubt a small bowel obstruction.  Still having regular stools.  Her symptoms are very likely related to suboptimally controlled GERD.  Query possibility of H. pylori infection given the severity of her symptoms.  We will plan further management of her reflux as per below.  Return for follow-up in 3 months.

## 2018-01-11 NOTE — Progress Notes (Signed)
cc'ed to pcp °

## 2018-01-29 ENCOUNTER — Telehealth: Payer: Self-pay | Admitting: Internal Medicine

## 2018-01-29 NOTE — Telephone Encounter (Signed)
Spoke with pt. Pt wanted an RX for Dexilant sent to her pharmacy. Pt doesn't have insurance currently and I discussed pt assistance for Dexilant. Will mail pt Dexilant pt assistance forms to pt.Holding pts name for samples when samples arrive in office, we are currently out of samples.  EG,  pt wants to know in the mean time what she can take in place of the dexilant until she gets pt assistance? Pt also said the Dexilant has worked well. Pt hasn't had any diarrhea or vomiting while being on Dexilant samples. Pt isn't sure if she needs to go forward with her EGD.

## 2018-01-29 NOTE — Telephone Encounter (Signed)
Pt said the dexilant samples helped and she would like a prescription called into [facility] [place]. She doesn't have insurance at the moment and wanted to know if there was a generic for Dexilant and also wanted to know since the medicine was helping should she still have the EGD on 7/11 with RMR or should she wait. She said that [facility] and doesn't know what her future will be in the next few weeks/month. I told her that would be up to her if she wanted to wait and reschedule. Please advise about the dexilant because she can't afford an expensive prescription right now. [phone]

## 2018-01-30 ENCOUNTER — Telehealth: Payer: Self-pay | Admitting: Internal Medicine

## 2018-01-30 NOTE — Telephone Encounter (Signed)
Spoke with pt , samples aren't available. Pts name is being held for Dexilant samples. Pt is also following up from message sent to provider 01/29/18. Pt is aware that we will be in contact with her.

## 2018-01-30 NOTE — Telephone Encounter (Signed)
Pt called again today to follow up on the phone note from yesterday. She wanted to know if we have any Dexilant samples and does she need to move forward with having her EGD done. Please call [facility] or [facility] [phone]

## 2018-01-30 NOTE — Telephone Encounter (Signed)
Dexilant samples came in today. Pts samples are ready for pickup.

## 2018-02-02 NOTE — Telephone Encounter (Signed)
Noted  

## 2018-02-06 ENCOUNTER — Ambulatory Visit: Payer: Self-pay | Admitting: Physician Assistant

## 2018-02-26 NOTE — Patient Instructions (Signed)
Elizabeth Krueger  02/26/2018     @PREFPERIOPPHARMACY @   Your procedure is scheduled on  03/08/2018 .  Report to [facility] at  1200  P.M.  Call this number if you have problems the morning of surgery:  [phone]   Remember:  Do not eat or drink after midnight.  You may drink clear liquids until  (follow the instructions given to you) .  Clear liquids allowed are:                    Water, Juice (non-citric and without pulp), Carbonated beverages, Clear Tea, Black Coffee only, Plain Jell-O only, Gatorade and Plain Popsicles only    Take these medicines the morning of surgery with A SIP OF WATER Prevacid, reglan, metoprolol. Use your inhaler and your nebulizer before you come.    Do not wear jewelry, make-up or nail polish.  Do not wear lotions, powders, or perfumes, or deodorant.  Do not shave 48 hours prior to surgery.  Men may shave face and neck.  Do not bring valuables to the hospital.  [facility] is not responsible for any belongings or valuables.  Contacts, dentures or bridgework may not be worn into surgery.  Leave your suitcase in the car.  After surgery it may be brought to your room.  For patients admitted to the hospital, discharge time will be determined by your treatment team.  Patients discharged the day of surgery will not be allowed to drive home.   Name and phone number of your driver:   Family Special instructions:  Follow the diet and prep instructions given to you by Dr Charna Busman office.  Please read over the following fact sheets that you were given. Anesthesia Post-op Instructions and Care and Recovery After Surgery       Esophagogastroduodenoscopy Esophagogastroduodenoscopy (EGD) is a procedure to examine the lining of the esophagus, stomach, and first part of the small intestine (duodenum). This procedure is done to check for problems such as inflammation, bleeding, ulcers, or growths. During this procedure, a long, flexible, lighted tube  with a camera attached (endoscope) is inserted down the throat. Tell a health care provider about:  Any allergies you have.  All medicines you are taking, including vitamins, herbs, eye drops, creams, and over-the-counter medicines.  Any problems you or family members have had with anesthetic medicines.  Any blood disorders you have.  Any surgeries you have had.  Any medical conditions you have.  Whether you are pregnant or may be pregnant. What are the risks? Generally, this is a safe procedure. However, problems may occur, including:  Infection.  Bleeding.  A tear (perforation) in the esophagus, stomach, or duodenum.  Trouble breathing.  Excessive sweating.  Spasms of the larynx.  A slowed heartbeat.  Low blood pressure.  What happens before the procedure?  Follow instructions from your health care provider about eating or drinking restrictions.  Ask your health care provider about: ? Changing or stopping your regular medicines. This is especially important if you are taking diabetes medicines or blood thinners. ? Taking medicines such as aspirin and ibuprofen. These medicines can thin your blood. Do not take these medicines before your procedure if your health care provider instructs you not to.  Plan to have someone take you home after the procedure.  If you wear dentures, be ready to remove them before the procedure. What happens during the procedure?  To reduce your risk  of infection, your health care team will wash or sanitize their hands.  An IV tube will be put in a vein in your hand or arm. You will get medicines and fluids through this tube.  You will be given one or more of the following: ? A medicine to help you relax (sedative). ? A medicine to numb the area (local anesthetic). This medicine may be sprayed into your throat. It will make you feel more comfortable and keep you from gagging or coughing during the procedure. ? A medicine for pain.  A  mouth guard may be placed in your mouth to protect your teeth and to keep you from biting on the endoscope.  You will be asked to lie on your left side.  The endoscope will be lowered down your throat into your esophagus, stomach, and duodenum.  Air will be put into the endoscope. This will help your health care provider see better.  The lining of your esophagus, stomach, and duodenum will be examined.  Your health care provider may: ? Take a tissue sample so it can be looked at in a lab (biopsy). ? Remove growths. ? Remove objects (foreign bodies) that are stuck. ? Treat any bleeding with medicines or other devices that stop tissue from bleeding. ? Widen (dilate) or stretch narrowed areas of your esophagus and stomach.  The endoscope will be taken out. The procedure may vary among health care providers and hospitals. What happens after the procedure?  Your blood pressure, heart rate, breathing rate, and blood oxygen level will be monitored often until the medicines you were given have worn off.  Do not eat or drink anything until the numbing medicine has worn off and your gag reflex has returned. This information is not intended to replace advice given to you by your health care provider. Make sure you discuss any questions you have with your health care provider. Document Released: 12/16/2004 Document Revised: 01/21/2016 Document Reviewed: 07/09/2015 Elsevier Interactive Patient Education  2018 [employer]. Esophagogastroduodenoscopy, Care After Refer to this sheet in the next few weeks. These instructions provide you with information about caring for yourself after your procedure. Your health care provider may also give you more specific instructions. Your treatment has been planned according to current medical practices, but problems sometimes occur. Call your health care provider if you have any problems or questions after your procedure. What can I expect after the  procedure? After the procedure, it is common to have:  A sore throat.  Nausea.  Bloating.  Dizziness.  Fatigue.  Follow these instructions at home:  Do not eat or drink anything until the numbing medicine (local anesthetic) has worn off and your gag reflex has returned. You will know that the local anesthetic has worn off when you can swallow comfortably.  Do not drive for 24 hours if you received a medicine to help you relax (sedative).  If your health care provider took a tissue sample for testing during the procedure, make sure to get your test results. This is your responsibility. Ask your health care provider or the department performing the test when your results will be ready.  Keep all follow-up visits as told by your health care provider. This is important. Contact a health care provider if:  You cannot stop coughing.  You are not urinating.  You are urinating less than usual. Get help right away if:  You have trouble swallowing.  You cannot eat or drink.  You have throat or chest  pain that gets worse.  You are dizzy or light-headed.  You faint.  You have nausea or vomiting.  You have chills.  You have a fever.  You have severe abdominal pain.  You have black, tarry, or bloody stools. This information is not intended to replace advice given to you by your health care provider. Make sure you discuss any questions you have with your health care provider. Document Released: 08/01/2012 Document Revised: 01/21/2016 Document Reviewed: 07/09/2015 Elsevier Interactive Patient Education  2018 Else[employer] Inc.  Monitored Anesthesia Care Anesthesia is a term that refers to techniques, procedures, and medicines that help a person stay safe and comfortable during a medical procedure. Monitored anesthesia care, or sedation, is one type of anesthesia. Your anesthesia specialist may recommend sedation if you will be having a procedure that does not require you to be  unconscious, such as:  Cataract surgery.  A dental procedure.  A biopsy.  A colonoscopy.  During the procedure, you may receive a medicine to help you relax (sedative). There are three levels of sedation:  Mild sedation. At this level, you may feel awake and relaxed. You will be able to follow directions.  Moderate sedation. At this level, you will be sleepy. You may not remember the procedure.  Deep sedation. At this level, you will be asleep. You will not remember the procedure.  The more medicine you are given, the deeper your level of sedation will be. Depending on how you respond to the procedure, the anesthesia specialist may change your level of sedation or the type of anesthesia to fit your needs. An anesthesia specialist will monitor you closely during the procedure. Let your health care provider know about:  Any allergies you have.  All medicines you are taking, including vitamins, herbs, eye drops, creams, and over-the-counter medicines.  Any use of steroids (by mouth or as a cream).  Any problems you or family members have had with sedatives and anesthetic medicines.  Any blood disorders you have.  Any surgeries you have had.  Any medical conditions you have, such as sleep apnea.  Whether you are pregnant or may be pregnant.  Any use of cigarettes, alcohol, or street drugs. What are the risks? Generally, this is a safe procedure. However, problems may occur, including:  Getting too much medicine (oversedation).  Nausea.  Allergic reaction to medicines.  Trouble breathing. If this happens, a breathing tube may be used to help with breathing. It will be removed when you are awake and breathing on your own.  Heart trouble.  Lung trouble.  Before the procedure Staying hydrated Follow instructions from your health care provider about hydration, which may include:  Up to 2 hours before the procedure - you may continue to drink clear liquids, such as  water, clear fruit juice, black coffee, and plain tea.  Eating and drinking restrictions Follow instructions from your health care provider about eating and drinking, which may include:  8 hours before the procedure - stop eating heavy meals or foods such as meat, fried foods, or fatty foods.  6 hours before the procedure - stop eating light meals or foods, such as toast or cereal.  6 hours before the procedure - stop drinking milk or drinks that contain milk.  2 hours before the procedure - stop drinking clear liquids.  Medicines Ask your health care provider about:  Changing or stopping your regular medicines. This is especially important if you are taking diabetes medicines or blood thinners.  Taking medicines  such as aspirin and ibuprofen. These medicines can thin your blood. Do not take these medicines before your procedure if your health care provider instructs you not to.  Tests and exams  You will have a physical exam.  You may have blood tests done to show: ? How well your kidneys and liver are working. ? How well your blood can clot.  General instructions  Plan to have someone take you home from the hospital or clinic.  If you will be going home right after the procedure, plan to have someone with you for 24 hours.  What happens during the procedure?  Your blood pressure, heart rate, breathing, level of pain and overall condition will be monitored.  An IV tube will be inserted into one of your veins.  Your anesthesia specialist will give you medicines as needed to keep you comfortable during the procedure. This may mean changing the level of sedation.  The procedure will be performed. After the procedure  Your blood pressure, heart rate, breathing rate, and blood oxygen level will be monitored until the medicines you were given have worn off.  Do not drive for 24 hours if you received a sedative.  You may: ? Feel sleepy, clumsy, or nauseous. ? Feel  forgetful about what happened after the procedure. ? Have a sore throat if you had a breathing tube during the procedure. ? Vomit. This information is not intended to replace advice given to you by your health care provider. Make sure you discuss any questions you have with your health care provider. Document Released: 05/11/2005 Document Revised: 01/22/2016 Document Reviewed: 12/06/2015 Elsevier Interactive Patient Education  2018 Elsevier Inc. Monitored Anesthesia Care, Care After These instructions provide you with information about caring for yourself after your procedure. Your health care provider may also give you more specific instructions. Your treatment has been planned according to current medical practices, but problems sometimes occur. Call your health care provider if you have any problems or questions after your procedure. What can I expect after the procedure? After your procedure, it is common to:  Feel sleepy for several hours.  Feel clumsy and have poor balance for several hours.  Feel forgetful about what happened after the procedure.  Have poor judgment for several hours.  Feel nauseous or vomit.  Have a sore throat if you had a breathing tube during the procedure.  Follow these instructions at home: For at least 24 hours after the procedure:   Do not: ? Participate in activities in which you could fall or become injured. ? Drive. ? Use heavy machinery. ? Drink alcohol. ? Take sleeping pills or medicines that cause drowsiness. ? Make important decisions or sign legal documents. ? Take care of children on your own.  Rest. Eating and drinking  Follow the diet that is recommended by your health care provider.  If you vomit, drink water, juice, or soup when you can drink without vomiting.  Make sure you have little or no nausea before eating solid foods. General instructions  Have a responsible adult stay with you until you are awake and alert.  Take  over-the-counter and prescription medicines only as told by your health care provider.  If you smoke, do not smoke without supervision.  Keep all follow-up visits as told by your health care provider. This is important. Contact a health care provider if:  You keep feeling nauseous or you keep vomiting.  You feel light-headed.  You develop a rash.  You have a  fever. Get help right away if:  You have trouble breathing. This information is not intended to replace advice given to you by your health care provider. Make sure you discuss any questions you have with your health care provider. Document Released: 12/06/2015 Document Revised: 04/06/2016 Document Reviewed: 12/06/2015 Elsevier Interactive Patient Education  [employer].

## 2018-03-02 ENCOUNTER — Encounter (HOSPITAL_COMMUNITY): Payer: Self-pay

## 2018-03-02 ENCOUNTER — Telehealth: Payer: Self-pay | Admitting: Internal Medicine

## 2018-03-02 ENCOUNTER — Encounter (HOSPITAL_COMMUNITY)
Admission: RE | Admit: 2018-03-02 | Discharge: 2018-03-02 | Disposition: A | Discharge status: Home / Self Care | Payer: Self-pay | Source: Ambulatory Visit | Attending: Internal Medicine | Admitting: Internal Medicine

## 2018-03-02 NOTE — Telephone Encounter (Signed)
Spoke with pt and she does want to cancel her procedure. Pt hasn't had any nausea or acid reflux since she started Dexilant. Pt receives samples from our office and would like to look into pt assistance for medication due to not having any insurance at this time. Pt assistance paper work will be mailed to pt and she will return paper work to our office. Pt will look for insurance 08/2018 and will reconsider procedure if RMR feels it's necessary.

## 2018-03-02 NOTE — Telephone Encounter (Signed)
Called Eber Jonesarolyn and LMOVM advising of cancellation. Fowarding to EG as an [place]

## 2018-03-02 NOTE — Telephone Encounter (Signed)
Pt has a pre op scheduled for today and wants to cancel and to cancel her procedure with RMR for 7/11. She said the dexilant samples were working and she hasn't had any problems since she started taking them. She wants to know if it's necessary to have the procedure if the medication is helping. She also said she doesn't have insurance and wants more Dexilant samples or if a prescription is called in she said she knew the dexilant was too expensive and was there something cheaper. Please advise and call her at [phone]

## 2018-03-06 ENCOUNTER — Telehealth: Payer: Self-pay | Admitting: Internal Medicine

## 2018-03-06 NOTE — Telephone Encounter (Signed)
Patient called asking for Dexilant samples. She said that she doesn't have insurance and was waiting on forms to be mailed to her about getting the medication cheaper. Please call her at [phone]

## 2018-03-06 NOTE — Telephone Encounter (Signed)
Samples were left up front for pt with pt assistance pt work. Pt didn't received previously mailed pt assistant paper work.

## 2018-03-08 ENCOUNTER — Encounter (HOSPITAL_COMMUNITY): Admission: RE | Payer: Self-pay | Source: Ambulatory Visit

## 2018-03-08 ENCOUNTER — Ambulatory Visit (HOSPITAL_COMMUNITY): Admission: RE | Admit: 2018-03-08 | Payer: Self-pay | Source: Ambulatory Visit | Admitting: Internal Medicine

## 2018-03-08 SURGERY — ESOPHAGOGASTRODUODENOSCOPY (EGD) WITH PROPOFOL
Anesthesia: Monitor Anesthesia Care

## 2018-03-09 NOTE — Telephone Encounter (Signed)
Noted  

## 2018-03-22 ENCOUNTER — Telehealth: Payer: Self-pay

## 2018-03-22 NOTE — Telephone Encounter (Signed)
Noted  

## 2018-03-22 NOTE — Telephone Encounter (Signed)
Pt came in and brought back her pt assistance forms. She forgot her income information, she is going to email it to me as soon as she can. She said it may take her a couple of days to get that done. She asked if she could get an rx for dexilant sent to [facility] in [place], she has gotten a discount card for [employer] online and wants to see if she can afford it until she gets approved for dexilant assistance. I have given the pt #3 boxes of dexilant. She said dexilant was a miracle medication and was working great.   Eric please send in rx.

## 2018-03-26 NOTE — Telephone Encounter (Signed)
Pt assistance papers for Dexilant were received and [facility] [phone]. Waiting to hear back from [employer].

## 2018-03-27 MED ORDER — DEXLANSOPRAZOLE 60 MG PO CPDR: 60.0000 mg | DELAYED_RELEASE_CAPSULE | Freq: Every day | ORAL | 3 refills | Status: DC

## 2018-03-27 NOTE — Telephone Encounter (Addendum)
Per EG- ok to send in dexilant 60mg  #30 with 3 refills. rx sent in and pt is aware

## 2018-03-27 NOTE — Addendum Note (Signed)
Addended by: Myra RudeLAWSON, Joelee Snoke H on: 03/27/2018 08:22 AM   Modules accepted: Orders

## 2018-03-28 ENCOUNTER — Other Ambulatory Visit: Payer: Self-pay | Admitting: Physician Assistant

## 2018-03-28 DIAGNOSIS — K219 Gastro-esophageal reflux disease without esophagitis: Secondary | ICD-10-CM

## 2018-03-28 DIAGNOSIS — I10 Essential (primary) hypertension: Secondary | ICD-10-CM

## 2018-03-28 NOTE — Telephone Encounter (Signed)
Noted, no further recommendations. 

## 2018-03-29 NOTE — Telephone Encounter (Signed)
Called pt to schedule appt and pt states she had to cancel appt in June due to her father passing away and she has a bill here and wants to get it paid off before coming back and adding to it. She also says she just started a new job and can't schedule at this time but will call back in a few weeks to schedule follow up with Lawanna KobusAngel.

## 2018-03-29 NOTE — Telephone Encounter (Signed)
Patient needs to make an appt for refills

## 2018-04-04 ENCOUNTER — Telehealth: Payer: Self-pay

## 2018-04-04 NOTE — Telephone Encounter (Signed)
Faxed Pt assistance forms for Dexilant. Pt sent an e-mail stating information was left off of the forms submitted. Forms have been faxed. LMOM , informing pt that forms have been faxed.

## 2018-04-12 NOTE — Telephone Encounter (Signed)
Approval letter was received from [employer] pt [place]. Pt should receive medication in a few days. Pt is aware that she has been approved through 08/28/18. Approval letter was scanned.

## 2018-04-12 NOTE — Telephone Encounter (Signed)
noted 

## 2018-04-13 ENCOUNTER — Ambulatory Visit: Payer: Self-pay | Admitting: Nurse Practitioner

## 2018-11-29 IMAGING — CT CT ABD-PELV W/ CM
2 of 4 series · 16 of 46 positions shown, 18 images · IV contrast (iopamidol)
Comparison: 07/25/2012 CT abdomen and pelvis

CLINICAL DATA: 51 y/o  F; nausea and vomiting.

EXAM:
CT ABDOMEN AND PELVIS WITH CONTRAST
TECHNIQUE: Multidetector CT imaging of the abdomen and pelvis was performed
using the standard protocol following bolus administration of
intravenous contrast.
CONTRAST:  100mL PGRL19-ZAA IOPAMIDOL (PGRL19-ZAA) INJECTION 61%

[Series 2: axial st · axial · 0.98mm/px · z∈[-518,-43]mm · 13 of 105 slices shown, 15 images]
[im 5/105  soft-tissue]
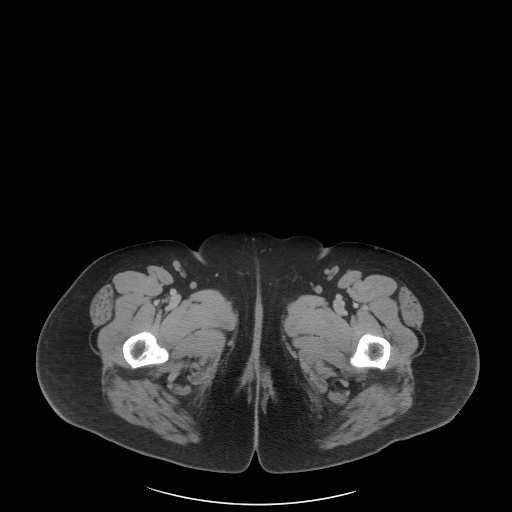
[im 5/105  bone]
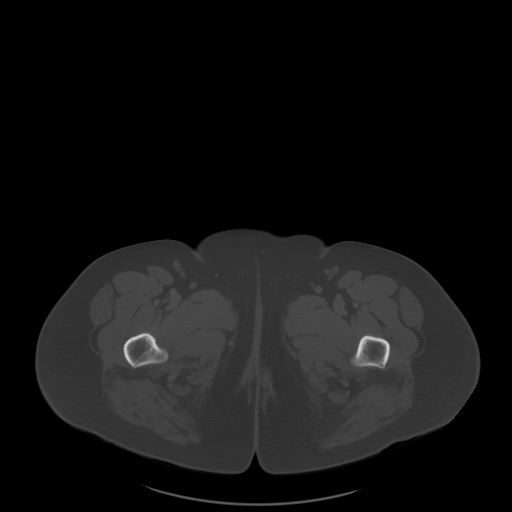
[im 14/105  soft-tissue]
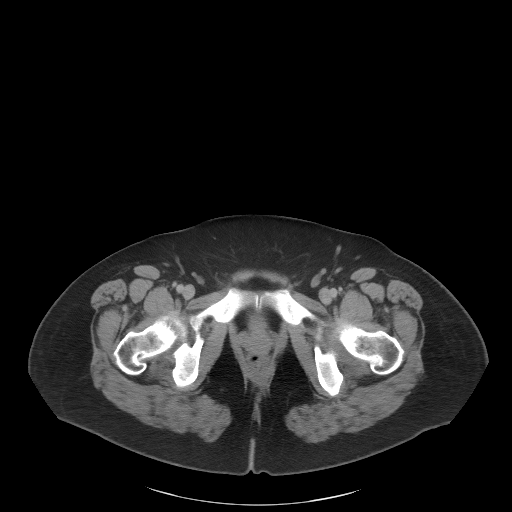
[im 23/105  soft-tissue]
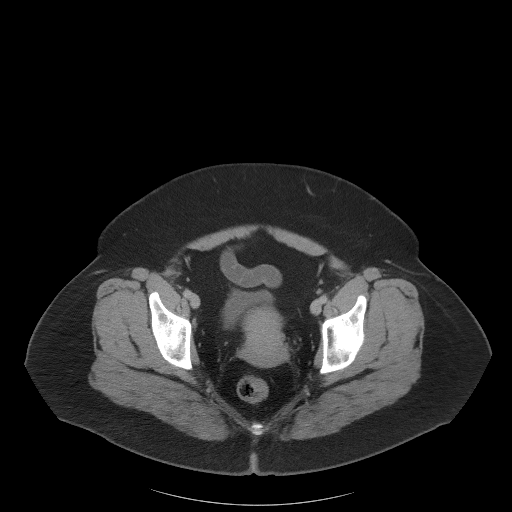
[im 28/105  soft-tissue]
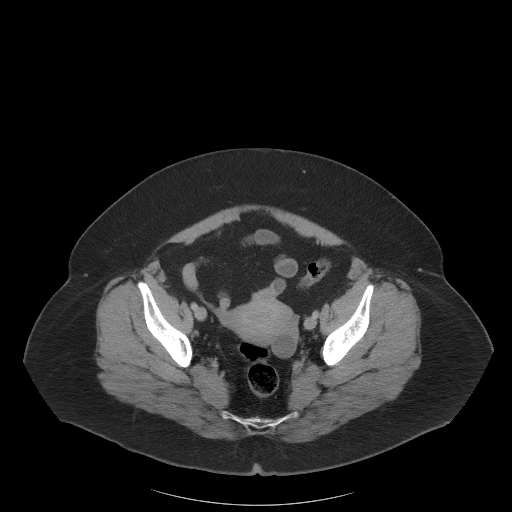
[im 37/105  soft-tissue]
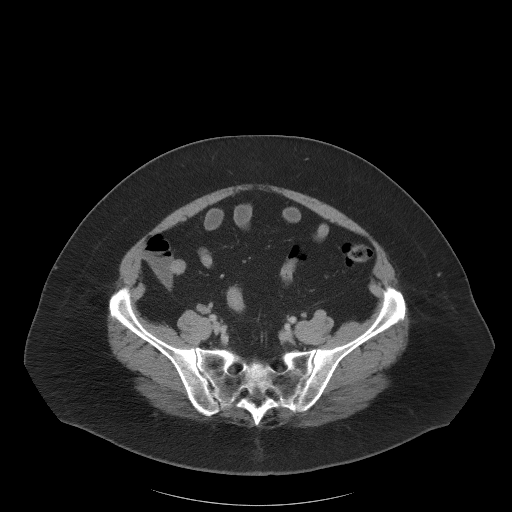
[im 46/105  soft-tissue]
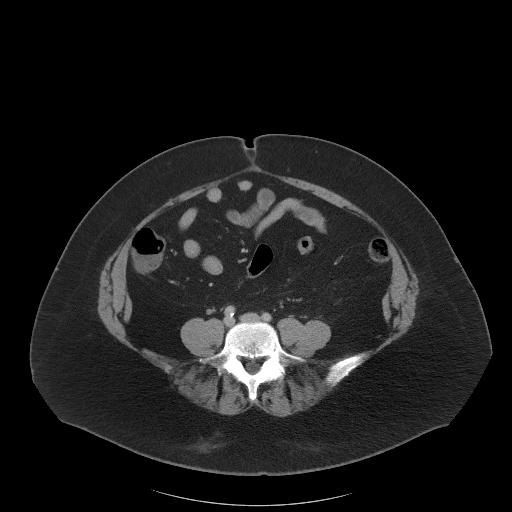
[im 55/105  soft-tissue]
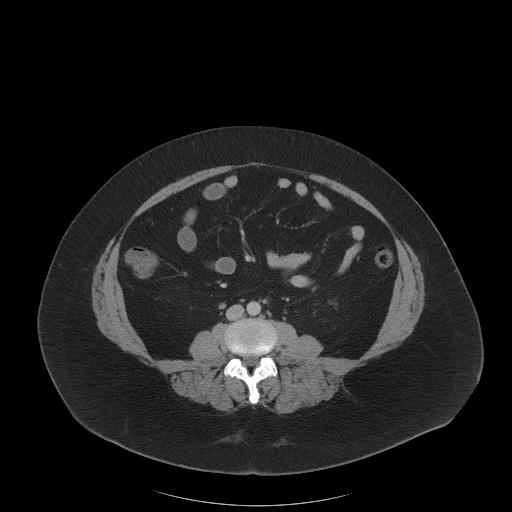
[im 59/105  soft-tissue]
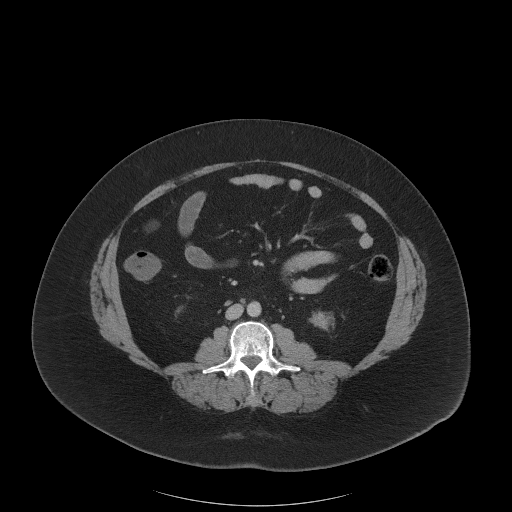
[im 68/105  soft-tissue]
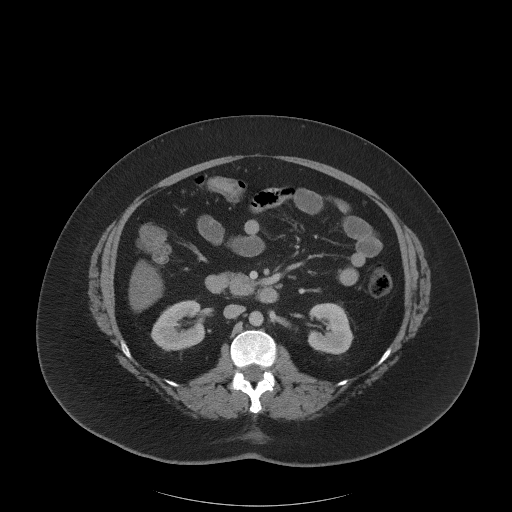
[im 68/105  bone]
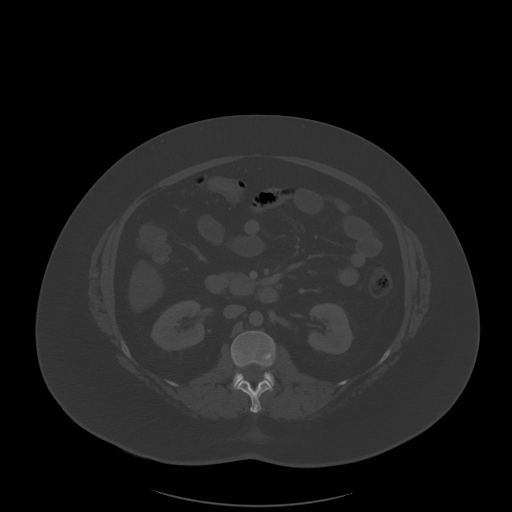
[im 77/105  soft-tissue]
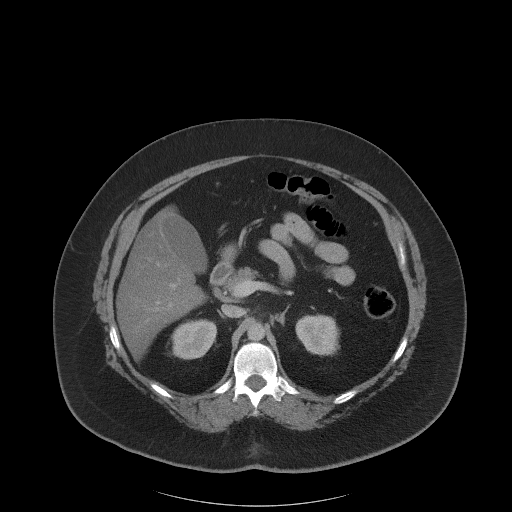
[im 82/105  soft-tissue]
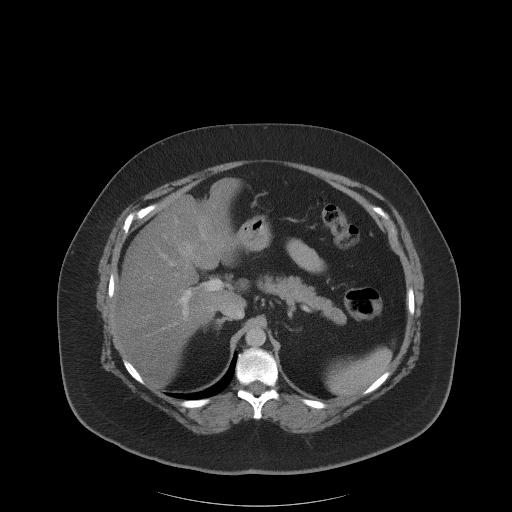
[im 91/105  soft-tissue]
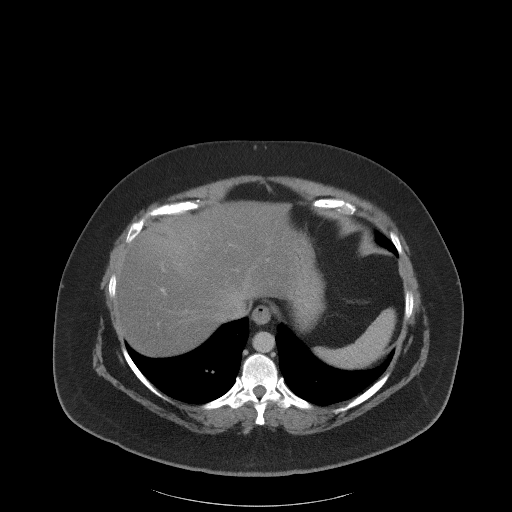
[im 100/105  soft-tissue]
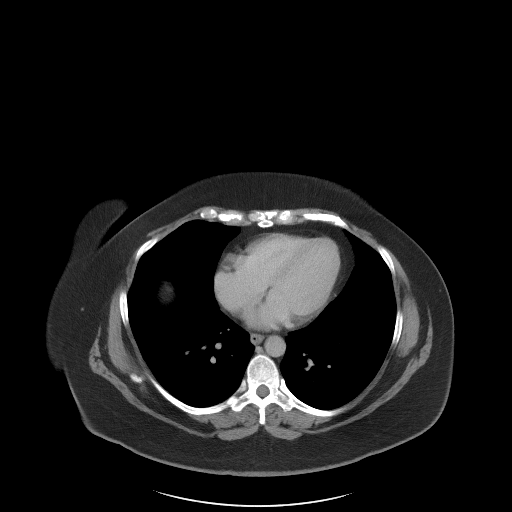

[Series 5: coronal st · coronal · 0.91mm/px · 3 of 110 slices shown]
[im 37/110  soft-tissue]
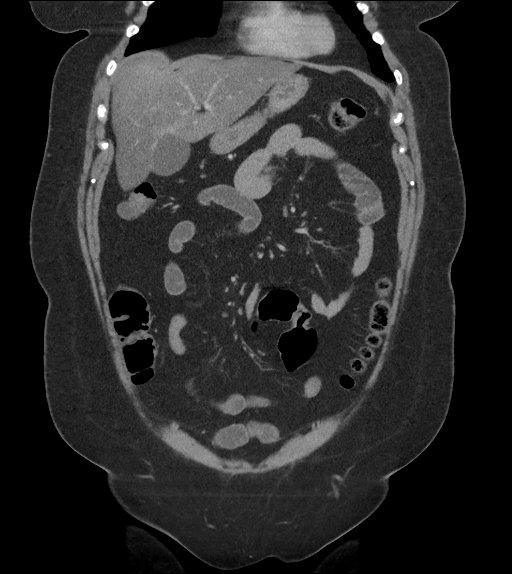
[im 49/110  soft-tissue]
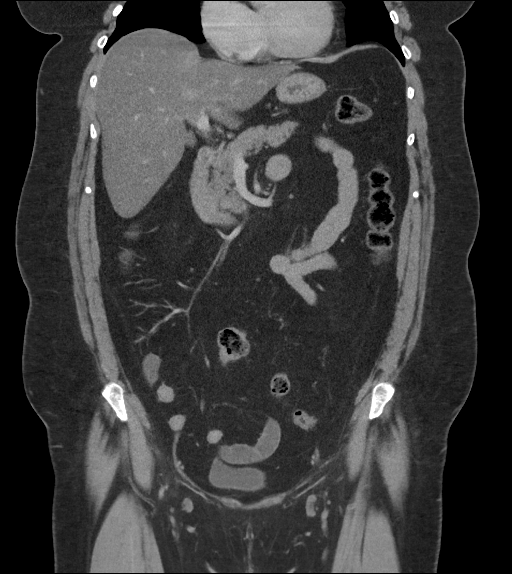
[im 61/110  soft-tissue]
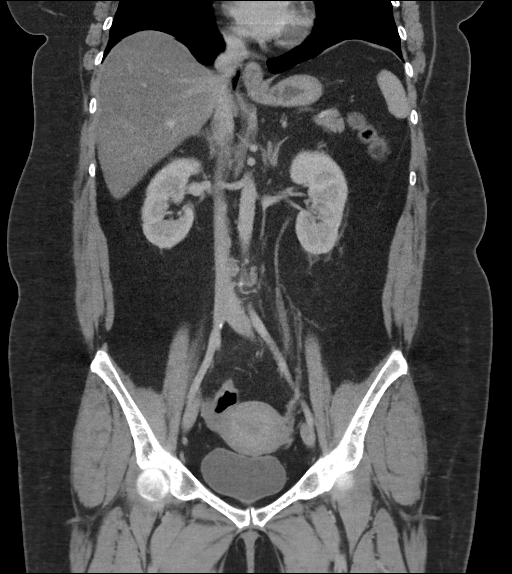

[16 of 46 positions shown; findings below may reference images not displayed]

FINDINGS: Lower chest: No acute abnormality.

Hepatobiliary: Stable 2.1 cm liver segment [DATE] hemangioma and 0.5 cm
probable hemangioma in liver segment 6 (series 2, image 33 and 11).
Normal gallbladder. Hepatic steatosis. No biliary ductal dilatation.

Pancreas: Unremarkable. No pancreatic ductal dilatation or
surrounding inflammatory changes.

Spleen: Normal in size without focal abnormality.

Adrenals/Urinary Tract: Adrenal glands are unremarkable. Kidneys are
normal, without renal calculi, focal lesion, or hydronephrosis.
Bladder is unremarkable.

Stomach/Bowel: Stomach is within normal limits. Appendix appears
normal. No evidence of bowel wall thickening, distention, or
inflammatory changes.

Vascular/Lymphatic: No significant vascular findings are present. No
enlarged abdominal or pelvic lymph nodes.

Reproductive: 22 mm well-circumscribed wall cyst with benign
features.

Other: No abdominal wall hernia or abnormality. No abdominopelvic
ascites.

Musculoskeletal: Stable lumbar spondylosis greatest at L5-S1 level
where disc and facet degenerative changes result in bilateral neural
foraminal stenosis. No acute osseous abnormality identified.
IMPRESSION: 1. No acute process identified.
2. Stable liver hemangiomas.
3. Hepatic steatosis.

By: Tc Mustafa Nerse M.D.

## 2019-02-18 ENCOUNTER — Telehealth: Payer: Self-pay | Admitting: Physician Assistant

## 2019-08-02 ENCOUNTER — Telehealth: Payer: Self-pay

## 2019-08-02 NOTE — Telephone Encounter (Signed)
VM received. [phone] or [phone]. Pt would like to discuss Dexilant RX

## 2019-08-02 NOTE — Telephone Encounter (Signed)
Routing to AM.  

## 2019-08-07 NOTE — Telephone Encounter (Signed)
Spoke with pt. Samples of Dexilant are ready for pickup. Paper work for pt assistance renewal for [employer] is ready for pickup. When pt returns papers, we will submit forms to [place].

## 2019-08-13 NOTE — Telephone Encounter (Signed)
Pt called and is going to drop off proof of income for 2020 to submit with pt assistance forms. Sample of Dexilant is ready for pick up as well.

## 2020-01-24 ENCOUNTER — Other Ambulatory Visit: Payer: Self-pay | Admitting: Nurse Practitioner

## 2020-01-29 NOTE — Telephone Encounter (Signed)
Called lmom to schedule an appt

## 2020-01-29 NOTE — Telephone Encounter (Signed)
Sending in 6 month Rx. Patient needs to be seen in the next 3-6 months for additional refills. We have not seen her since 2019.

## 2020-01-29 NOTE — Telephone Encounter (Signed)
Patient called back. She states she is going to see PCP and they are going to refill it.

## 2021-09-17 DIAGNOSIS — N951 Menopausal and female climacteric states: Secondary | ICD-10-CM | POA: Diagnosis not present

## 2021-09-17 DIAGNOSIS — Z3040 Encounter for surveillance of contraceptives, unspecified: Secondary | ICD-10-CM | POA: Diagnosis not present

## 2021-09-17 DIAGNOSIS — G4709 Other insomnia: Secondary | ICD-10-CM | POA: Diagnosis not present

## 2022-01-05 ENCOUNTER — Emergency Department (HOSPITAL_COMMUNITY)
Admission: EM | Admit: 2022-01-05 | Discharge: 2022-01-05 | Disposition: A | Discharge status: Home / Self Care | Payer: 59 | Attending: Emergency Medicine | Admitting: Emergency Medicine

## 2022-01-05 DIAGNOSIS — R1010 Upper abdominal pain, unspecified: Secondary | ICD-10-CM | POA: Diagnosis not present

## 2022-01-05 DIAGNOSIS — Z79899 Other long term (current) drug therapy: Secondary | ICD-10-CM | POA: Diagnosis not present

## 2022-01-05 DIAGNOSIS — R1013 Epigastric pain: Secondary | ICD-10-CM | POA: Diagnosis not present

## 2022-01-05 DIAGNOSIS — R112 Nausea with vomiting, unspecified: Secondary | ICD-10-CM | POA: Diagnosis not present

## 2022-01-05 DIAGNOSIS — D72829 Elevated white blood cell count, unspecified: Secondary | ICD-10-CM | POA: Diagnosis not present

## 2022-01-05 LAB — CBC WITH DIFFERENTIAL/PLATELET
Abs Immature Granulocytes: 0.06 10*3/uL (ref 0.00–0.07)
Basophils Absolute: 0.1 10*3/uL (ref 0.0–0.1)
Basophils Relative: 0 %
Eosinophils Absolute: 0.2 10*3/uL (ref 0.0–0.5)
Eosinophils Relative: 1 %
HCT: 44.9 % (ref 36.0–46.0)
Hemoglobin: 15 g/dL (ref 12.0–15.0)
Immature Granulocytes: 0 %
Lymphocytes Relative: 8 %
Lymphs Abs: 1.4 10*3/uL (ref 0.7–4.0)
MCH: 30.9 pg (ref 26.0–34.0)
MCHC: 33.4 g/dL (ref 30.0–36.0)
MCV: 92.4 fL (ref 80.0–100.0)
Monocytes Absolute: 0.7 10*3/uL (ref 0.1–1.0)
Monocytes Relative: 4 %
Neutro Abs: 14.3 10*3/uL — ABNORMAL HIGH (ref 1.7–7.7)
Neutrophils Relative %: 87 %
Platelets: 259 10*3/uL (ref 150–400)
RBC: 4.86 MIL/uL (ref 3.87–5.11)
RDW: 12.4 % (ref 11.5–15.5)
WBC: 16.6 10*3/uL — ABNORMAL HIGH (ref 4.0–10.5)
nRBC: 0 % (ref 0.0–0.2)

## 2022-01-05 LAB — URINALYSIS, ROUTINE W REFLEX MICROSCOPIC
Bilirubin Urine: NEGATIVE
Glucose, UA: NEGATIVE mg/dL
Hgb urine dipstick: NEGATIVE
Ketones, ur: 20 mg/dL — AB
Nitrite: NEGATIVE
Protein, ur: 100 mg/dL — AB
Specific Gravity, Urine: 1.03 (ref 1.005–1.030)
pH: 5 (ref 5.0–8.0)

## 2022-01-05 LAB — COMPREHENSIVE METABOLIC PANEL
ALT: 25 U/L (ref 0–44)
AST: 16 U/L (ref 15–41)
Albumin: 3.9 g/dL (ref 3.5–5.0)
Alkaline Phosphatase: 53 U/L (ref 38–126)
Anion gap: 11 (ref 5–15)
BUN: 22 mg/dL — ABNORMAL HIGH (ref 6–20)
CO2: 20 mmol/L — ABNORMAL LOW (ref 22–32)
Calcium: 9 mg/dL (ref 8.9–10.3)
Chloride: 106 mmol/L (ref 98–111)
Creatinine, Ser: 0.98 mg/dL (ref 0.44–1.00)
GFR, Estimated: >60 mL/min (ref >60)
Glucose, Bld: 147 mg/dL — ABNORMAL HIGH (ref 70–99)
Potassium: 4 mmol/L (ref 3.5–5.1)
Sodium: 137 mmol/L (ref 135–145)
Total Bilirubin: 0.6 mg/dL (ref 0.3–1.2)
Total Protein: 7.9 g/dL (ref 6.5–8.1)

## 2022-01-05 LAB — LIPASE, BLOOD: Lipase: 24 U/L (ref 11–51)

## 2022-01-05 MED ORDER — DEXLANSOPRAZOLE 60 MG PO CPDR
60.0000 mg | DELAYED_RELEASE_CAPSULE | Freq: Every day | ORAL | 5 refills | Status: DC
Start: 2022-01-05 — End: 2022-03-11

## 2022-01-05 MED ORDER — ONDANSETRON HCL 4 MG/2ML IJ SOLN
4.0000 mg | Freq: Once | INTRAMUSCULAR | Status: AC
  Administered 2022-01-05: 4 mg via INTRAVENOUS
  Filled 2022-01-05: qty 2

## 2022-01-05 MED ORDER — ONDANSETRON 4 MG PO TBDP: ORAL_TABLET | ORAL | 0 refills | Status: DC

## 2022-01-05 MED ORDER — SODIUM CHLORIDE 0.9 % IV BOLUS
1000.0000 mL | Freq: Once | INTRAVENOUS | Status: AC
  Administered 2022-01-05: 1000 mL via INTRAVENOUS

## 2022-01-05 MED ORDER — METOCLOPRAMIDE HCL 5 MG/ML IJ SOLN
10.0000 mg | Freq: Once | INTRAMUSCULAR | Status: AC
  Administered 2022-01-05: 10 mg via INTRAVENOUS
  Filled 2022-01-05: qty 2

## 2022-01-05 MED ORDER — DIPHENHYDRAMINE HCL 50 MG/ML IJ SOLN
25.0000 mg | Freq: Once | INTRAMUSCULAR | Status: AC
  Administered 2022-01-05: 25 mg via INTRAVENOUS
  Filled 2022-01-05: qty 1

## 2022-01-05 NOTE — ED Provider Notes (Signed)
?[facility] ?Provider Note ? ? ?CSN: [id number] ?Arrival date & time: 01/05/22  0231 ? ?  ? ?History ? ?Chief Complaint  ?Patient presents with  ? Abdominal Pain  ? ? ?Elizabeth Krueger is a 56 y.o. female. ? ?Patient presents to the emergency department for evaluation of abdominal pain with nausea and vomiting.  Symptoms present for approximately 8 hours.  She reports numerous episodes of emesis with persistent upper abdominal pain.  Patient reports a history of GERD, takes Prevacid daily.  She tried to take Zofran tonight but vomited it back up. ? ? ?  ? ?Home Medications ?Prior to Admission medications   ?Medication Sig Start Date End Date Taking? Authorizing Provider  ?ondansetron (ZOFRAN-ODT) 4 MG disintegrating tablet 4mg  ODT q4 hours prn nausea/vomit 01/05/22  Yes Latreece Mochizuki, Gwenyth Allegra, MD  ?albuterol (PROVENTIL) (2.5 MG/3ML) 0.083% nebulizer solution Take 2.5 mg by nebulization every 6 (six) hours as needed for wheezing or shortness of breath.    [provider]  ?ALPRAZolam Duanne Moron) 0.5 MG tablet Take 0.5 tablets (0.25 mg total) by mouth at bedtime as needed for anxiety. 01/01/18   Terald Sleeper, PA-C  ?dexlansoprazole (DEXILANT) 60 MG capsule Take 1 capsule (60 mg total) by mouth daily. 01/05/22   Orpah Greek, MD  ?guaiFENesin (MUCINEX) 600 MG 12 hr tablet Take 600 mg by mouth as needed.     [provider]  ?hydrochlorothiazide (HYDRODIURIL) 25 MG tablet TAKE ONE (1) TABLET EACH DAY ?Patient taking differently: TAKE ONE (1) TABLET EACH DAY AS NEEDED 01/01/18   Terald Sleeper, PA-C  ?lansoprazole (PREVACID) 30 MG capsule TAKE ONE CAPSULE BY MOUTH TWICE A DAY 10/26/17   Terald Sleeper, PA-C  ?metoCLOPramide (REGLAN) 5 MG tablet Take 1 tablet (5 mg total) by mouth every 8 (eight) hours as needed for nausea (hiccups). 08/03/17 08/03/18  Dhungel, Flonnie Overman, MD  ?metoprolol succinate (TOPROL-XL) 100 MG 24 hr tablet TAKE ONE (1) TABLET EACH DAY 12/08/17   Terald Sleeper, PA-C   ?montelukast (SINGULAIR) 10 MG tablet TAKE 1 TABLET DAILY IN THE EVENING 01/01/18   Terald Sleeper, PA-C  ?PROAIR HFA 108 [id number] Base) MCG/ACT inhaler INHALE 2 PUFFS EVERY 4 HOURS AS NEEDED 09/01/17   Terald Sleeper, PA-C  ?   ? ?Allergies    ?Patient has no known allergies.   ? ?Review of Systems   ?Review of Systems ? ?Physical Exam ?Updated Vital Signs ?BP 109/79   Pulse 83   Temp 97.9 ?F (36.6 ?C) (Oral)   Resp 19   SpO2 98%  ?Physical Exam ?Vitals and nursing note reviewed.  ?Constitutional:   ?   General: She is not in acute distress. ?   Appearance: She is well-developed.  ?HENT:  ?   Head: Normocephalic and atraumatic.  ?   Mouth/Throat:  ?   Mouth: Mucous membranes are moist.  ?Eyes:  ?   General: Vision grossly intact. Gaze aligned appropriately.  ?   Extraocular Movements: Extraocular movements intact.  ?   Conjunctiva/sclera: Conjunctivae normal.  ?Cardiovascular:  ?   Rate and Rhythm: Normal rate and regular rhythm.  ?   Pulses: Normal pulses.  ?   Heart sounds: Normal heart sounds, S1 normal and S2 normal. No murmur heard. ?  No friction rub. No gallop.  ?Pulmonary:  ?   Effort: Pulmonary effort is normal. No respiratory distress.  ?   Breath sounds: Normal breath sounds.  ?Abdominal:  ?   General:  Bowel sounds are normal.  ?   Palpations: Abdomen is soft.  ?   Tenderness: There is abdominal tenderness in the epigastric area. There is no guarding or rebound.  ?   Hernia: No hernia is present.  ?Musculoskeletal:     ?   General: No swelling.  ?   Cervical back: Full passive range of motion without pain, normal range of motion and neck supple. No spinous process tenderness or muscular tenderness. Normal range of motion.  ?   Right lower leg: No edema.  ?   Left lower leg: No edema.  ?Skin: ?   General: Skin is warm and dry.  ?   Capillary Refill: Capillary refill takes less than 2 seconds.  ?   Findings: No ecchymosis, erythema, rash or wound.  ?Neurological:  ?   General: No focal deficit present.  ?    Mental Status: She is alert and oriented to person, place, and time.  ?   GCS: GCS eye subscore is 4. GCS verbal subscore is 5. GCS motor subscore is 6.  ?   Cranial Nerves: Cranial nerves 2-12 are intact.  ?   Sensory: Sensation is intact.  ?   Motor: Motor function is intact.  ?   Coordination: Coordination is intact.  ?Psychiatric:     ?   Attention and Perception: Attention normal.     ?   Mood and Affect: Mood normal.     ?   Speech: Speech normal.     ?   Behavior: Behavior normal.  ? ? ?ED Results / Procedures / Treatments   ?Labs ?(all labs ordered are listed, but only abnormal results are displayed) ?Labs Reviewed  ?CBC WITH DIFFERENTIAL/PLATELET - Abnormal; Notable for the following components:  ?    Result Value  ? WBC 16.6 (*)   ? Neutro Abs 14.3 (*)   ? All other components within normal limits  ?COMPREHENSIVE METABOLIC PANEL - Abnormal; Notable for the following components:  ? CO2 20 (*)   ? Glucose, Bld 147 (*)   ? BUN 22 (*)   ? All other components within normal limits  ?URINALYSIS, ROUTINE W REFLEX MICROSCOPIC - Abnormal; Notable for the following components:  ? Color, Urine AMBER (*)   ? APPearance HAZY (*)   ? Ketones, ur 20 (*)   ? Protein, ur 100 (*)   ? Leukocytes,Ua TRACE (*)   ? Bacteria, UA RARE (*)   ? All other components within normal limits  ?LIPASE, BLOOD  ? ? ?EKG ?EKG Interpretation ? ?Date/Time:  Wednesday Jan 05 2022 03:39:15 EDT ?Ventricular Rate:  84 ?PR Interval:  118 ?QRS Duration: 77 ?QT Interval:  359 ?QTC Calculation: 425 ?R Axis:   55 ?Text Interpretation: Sinus rhythm Borderline short PR interval Low voltage, precordial leads Confirmed by Orpah Greek [phone]) on 01/05/2022 3:48:05 AM ? ?Radiology ?No results found. ? ?Procedures ?Procedures  ? ? ?Medications Ordered in ED ?Medications  ?ondansetron (ZOFRAN) injection 4 mg (4 mg Intravenous Given 01/05/22 0336)  ?metoCLOPramide (REGLAN) injection 10 mg (10 mg Intravenous Given 01/05/22 0335)  ?diphenhydrAMINE  (BENADRYL) injection 25 mg (25 mg Intravenous Given 01/05/22 0335)  ?sodium chloride 0.9 % bolus 1,000 mL (1,000 mLs Intravenous New Bag/Given 01/05/22 0334)  ? ? ?ED Course/ Medical Decision Making/ A&P ?  ?                        ?Medical Decision Making ?Amount and/or  Complexity of Data Reviewed ?Labs: ordered. ? ?Risk ?Prescription drug management. ? ? ?Presents to the emerged department for evaluation of upper abdominal pain with nausea and vomiting.  Patient reports a long history of GERD with similar symptoms.  She was previously well controlled with Dexilant, but has not had follow-up with her GI in some time and is now taking Prevacid OTC. ? ?Differential diagnosis includes peptic ulcer disease, gastritis, GERD, cholecystitis, cardiac chest pain. ? ?EKG at arrival is unremarkable.  Patient symptoms are more consistent with GI etiology.  Lab work is unremarkable other than nonspecific leukocytosis.  Patient given antiemetics and IV fluids with significant improvement.  Recheck reveals benign, soft, nontender abdomen.  Do not see a reason for imaging at this time.  We will restart her Dexilant, have her follow-up with her gastroenterologist. ? ? ? ? ? ? ? ?Final Clinical Impression(s) / ED Diagnoses ?Final diagnoses:  ?Epigastric pain  ?Nausea and vomiting, unspecified vomiting type  ? ? ?Rx / DC Orders ?ED Discharge Orders   ? ?      Ordered  ?  dexlansoprazole (DEXILANT) 60 MG capsule  Daily       ? 01/05/22 0554  ?  ondansetron (ZOFRAN-ODT) 4 MG disintegrating tablet       ? 01/05/22 0554  ? ?  ?  ? ?  ? ? ?  ?Orpah Greek, MD ?01/05/22 [phone] ? ?

## 2022-01-05 NOTE — ED Triage Notes (Signed)
Pt came in with c/o epigastric pain and emesis since 1900 last night. States she has taken one zofran and threw it up.  ?

## 2022-01-05 NOTE — Discharge Instructions (Signed)
Stop Prevacid and start Dexilant.  Please schedule follow-up with your GI doctor. ?

## 2022-03-11 ENCOUNTER — Ambulatory Visit (INDEPENDENT_AMBULATORY_CARE_PROVIDER_SITE_OTHER): Payer: 59 | Admitting: Gastroenterology

## 2022-03-11 ENCOUNTER — Encounter: Payer: Self-pay | Admitting: Gastroenterology

## 2022-03-11 VITALS — BP 145/88 | HR 90 | Temp 97.3°F | Ht 67.0 in | Wt 210.2 lb

## 2022-03-11 DIAGNOSIS — Z1211 Encounter for screening for malignant neoplasm of colon: Secondary | ICD-10-CM | POA: Insufficient documentation

## 2022-03-11 DIAGNOSIS — K219 Gastro-esophageal reflux disease without esophagitis: Secondary | ICD-10-CM | POA: Diagnosis not present

## 2022-03-11 DIAGNOSIS — K760 Fatty (change of) liver, not elsewhere classified: Secondary | ICD-10-CM | POA: Diagnosis not present

## 2022-03-11 MED ORDER — LANSOPRAZOLE 30 MG PO CPDR: 30.0000 mg | DELAYED_RELEASE_CAPSULE | Freq: Two times a day (BID) | ORAL | 3 refills | Status: DC

## 2022-03-11 NOTE — Progress Notes (Signed)
GI Office Note    Referring Provider: Rebekah Chesterfield, NP Primary Care Physician:  Rebekah Chesterfield, NP  Primary Gastroenterologist: Roetta Sessions, MD   Chief Complaint   Chief Complaint  Patient presents with   Follow-up    Patient here today for a follow upon her Reflux, and IBS with diarrhea, which are both under control.She is taking Lansoprazole 30 mg once per day.She also reports a recent ED visit on 01/05/2022 due to Epigastric pain.    History of Present Illness   Elizabeth Krueger is a 56 y.o. female presenting today for follow up GERD, IBS.  Patient was last seen in May 2019.  She also has a history of hepatic steatosis.  Patient had recent ED visit back in May for similar episode that she has had in the past.  Symptoms initially started out with sulfur belches followed by bloating, vomiting, diarrhea.  Often patient has enough vomiting and diarrhea that she becomes dehydrated and goes to the ED.  Fortunately her episodes are sporadic, occurring about once per year.  She states she is only had 2 episodes that she was seen in 2019.  Last episode occurred after starting Rybelsus.  She was on it for about a week.  Patient states PCP started Rybelsus because her A1c was slightly elevated greater than 6 but less than 7.    In between episodes typically she does well.  Some heartburn at times, alwasys takes at least one lansoprazole 30mg  daily.  Typically takes at bedtime because of her symptoms predominantly being nocturnal.  Sometimes she takes an additional 1 during the day depending on if she plans to have been a trigger foods for the symptoms occur.  Usually only 1-2 times per month, she has breakthrough symptoms.  Really tries to allow 3 hours between eating and laying down but if she is not able to because she works late, she will sleep reclined in her adjustable bed.  Recently really try to lose some weight.  She is dropped about 18 pounds.  She is eating a low-carb.  She  started eating more cheese.  Initially she had some issues with her bowels being infrequent.  Now she is back to having a stool about every other day, soft to loose.  No melena or rectal bleeding.  Recent ED visit in May:  Glucose 147, BUN 22, creatinine 0.98, LFTs normal, lipase normal, white blood cell count 16,600, hemoglobin 15.  UA showed trace leukocytes, rare bacteria, negative nitrite, 6-10 WBC per high-power field.  Noted to be on over-the-counter Pepcid at that time, ED provider started her on Dexilant and advised to follow-up with GI.    Medications   Current Outpatient Medications  Medication Sig Dispense Refill   albuterol (PROVENTIL) (2.5 MG/3ML) 0.083% nebulizer solution Take 2.5 mg by nebulization every 6 (six) hours as needed for wheezing or shortness of breath.     ALPRAZolam (XANAX) 0.5 MG tablet Take 0.5 tablets (0.25 mg total) by mouth at bedtime as needed for anxiety. 60 tablet 0   guaiFENesin (MUCINEX) 600 MG 12 hr tablet Take 600 mg by mouth as needed.      lansoprazole (PREVACID) 30 MG capsule TAKE ONE CAPSULE BY MOUTH TWICE A DAY (Patient taking differently: Take 30 mg by mouth daily at 6 (six) AM.  Sometimes  takes an additional one.) 60 capsule 0   metoprolol succinate (TOPROL-XL) 100 MG 24 hr tablet TAKE ONE (1) TABLET EACH DAY 90 tablet 0  ondansetron (ZOFRAN-ODT) 4 MG disintegrating tablet 4mg  ODT q4 hours prn nausea/vomit 10 tablet 0   PROAIR HFA 108 (90 Base) MCG/ACT inhaler INHALE 2 PUFFS EVERY 4 HOURS AS NEEDED 8.5 g 0   No current facility-administered medications for this visit.    Allergies   Allergies as of 03/11/2022   (No Known Allergies)     Past Medical History   Past Medical History:  Diagnosis Date   Asthma    Facial tic    GERD (gastroesophageal reflux disease)    IBS (irritable bowel syndrome)     Past Surgical History   Past Surgical History:  Procedure Laterality Date   CESAREAN SECTION      Past Family History   Family  History  Problem Relation Age of Onset   Prostate cancer Father    Diabetes Father    Heart disease Father    COPD Father    Rheum arthritis Father    Diabetes Mother    Stomach cancer Maternal Grandmother    Liver cancer Paternal Grandfather    Colon cancer Neg Hx    Esophageal cancer Neg Hx     Past Social History   Social History   Socioeconomic History   Marital status: Married    Spouse name: Not on file   Number of children: Not on file   Years of education: Not on file   Highest education level: Not on file  Occupational History   Not on file  Tobacco Use   Smoking status: Former    Packs/day: 0.10    Years: 2.00    Total pack years: 0.20    Types: Pipe, Cigarettes    Quit date: 08/29/2000    Years since quitting: 21.5   Smokeless tobacco: Never  Vaping Use   Vaping Use: Never used  Substance and Sexual Activity   Alcohol use: Yes    Comment: occasionally   Drug use: No   Sexual activity: Not on file  Other Topics Concern   Not on file  Social History Narrative   Married, lives with spouse   1 child   3 cans of caffeine daily   10/27/2000   Social Determinants of Health   Financial Resource Strain: Not on file  Food Insecurity: Not on file  Transportation Needs: Not on file  Physical Activity: Not on file  Stress: Not on file  Social Connections: Not on file  Intimate Partner Violence: Not on file    Review of Systems   General: Negative for anorexia, unintentional weight loss, fever, chills, fatigue, weakness. ENT: Negative for hoarseness, difficulty swallowing , nasal congestion. CV: Negative for chest pain, angina, palpitations, dyspnea on exertion, peripheral edema.  Respiratory: Negative for dyspnea at rest, dyspnea on exertion, cough, sputum, wheezing.  GI: See history of present illness. GU:  Negative for dysuria, hematuria, urinary incontinence, urinary frequency, nocturnal urination.  Endo: Negative for unusual  weight change.     Physical Exam   BP (!) 145/88 (BP Location: Left Arm, Patient Position: Sitting, Cuff Size: Large)   Pulse 90   Temp (!) 97.3 F (36.3 C) (Oral)   Ht 5\' 7"  (1.702 m)   Wt 210 lb 3.2 oz (95.3 kg)   BMI 32.92 kg/m    General: Well-nourished, well-developed in no acute distress.  Eyes: No icterus. Mouth: Oropharyngeal mucosa moist and pink , no lesions erythema or exudate. Lungs: Clear to auscultation bilaterally.  Heart: Regular rate and rhythm, no murmurs rubs  or gallops.  Abdomen: Bowel sounds are normal, nontender, nondistended, no hepatosplenomegaly or masses,  no abdominal bruits or hernia , no rebound or guarding.  Rectal: Not performed Extremities: No lower extremity edema. No clubbing or deformities. Neuro: Alert and oriented x 4   Skin: Warm and dry, no jaundice.   Psych: Alert and cooperative, normal mood and affect.  Labs   See HPI  Imaging Studies   No results found.  Assessment   GERD: Typically suffers from nocturnal reflux but overall well controlled with lansoprazole 30 mg before evening meal.  Has breakthrough symptoms a couple of times per month, occasionally taking additional dose of lansoprazole.  Episodic vomiting/bloating/diarrhea over the years occurring about once per year, unclear etiology.  Unlikely biliary in origin.  She did well from 2019 up until recently, having an episode prompting ED visits for dehydration.  This occurred 1 week after starting Rybelsus, very well could have initiated onset of the symptoms.  Continue to monitor for now.  IBS: Given history in the past, clinically doing well.  Fatty liver: Noted on prior imaging in 2018, LFTs normal.  Patient working towards weight loss.  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  Discussed fatty liver at length with regards to potential for some patients to progress to cirrhosis over time.  Very  important for her to make lifestyle changes as discussed today.  Screening for colon cancer: No prior colonoscopy.  Somewhat reluctant at this time with daughter getting ready to have a baby.  She also does not like going to hospitals.  She is interested in Cologuard testing which I think is appropriate for her at this time.   PLAN   Lansoprazole 30 mg up to twice daily before meals.   Recommend LFTs yearly with PCP.  If any abnormalities she should let us know.   Instructions for fatty liver provided to patient.   Instructions for GERD provided to patient.   Cologuard test.   Return to the office in 6 months.  Consider baseline abdominal ultrasound with elastography at that time.   Patient will let us know if she has any recurrent episodes of persistent vomiting/diarrhea.      Leanna Battles. Melvyn Neth, MHS, PA-C [facility]

## 2022-03-11 NOTE — Patient Instructions (Signed)
Continue lansoprazole 30 mg up to twice daily before a meal.  New prescription sent to your pharmacy. Complete Cologuard testing.  We will be in touch with results as available. Let me know if you have any additional episodes of vomiting, diarrhea as discussed today.  If they become more frequent, would consider further work-up. For fatty liver, continue to work on reaching healthier weight.  Stay as active as possible, try walking 20 to 30 minutes 5 days weekly to start off.  Limit alcohol consumption, an occasional drink is okay but no more than 2 drinks in 24 hours.  See below for additional details. Return office visit in 6 months, consider ultrasound of the liver at that time. Recommend having liver blood work checked at least once per year, this can be done with your PCP.   Fatty Liver Disease  The liver converts food into energy, removes toxic material from the blood, makes important proteins, and absorbs necessary vitamins from food. Fatty liver disease occurs when too much fat has built up in your liver cells. Fatty liver disease is also called hepatic steatosis. In many cases, fatty liver disease does not cause symptoms or problems. It is often diagnosed when tests are being done for other reasons. However, over time, fatty liver can cause inflammation that may lead to more serious liver problems, such as scarring of the liver (cirrhosis) and liver failure. Fatty liver is associated with insulin resistance, increased body fat, high blood pressure (hypertension), and high cholesterol. These are features of metabolic syndrome and increase your risk for stroke, diabetes, and heart disease. What are the causes? This condition may be caused by components of metabolic syndrome: Obesity. Insulin resistance. High cholesterol. Other causes: Alcohol abuse. Poor nutrition. Cushing syndrome. Pregnancy. Certain drugs. Poisons. Some viral infections. What increases the risk? You are more  likely to develop this condition if you: Abuse alcohol. Are overweight. Have diabetes. Have hepatitis. Have a high triglyceride level. Are pregnant. What are the signs or symptoms? Fatty liver disease often does not cause symptoms. If symptoms do develop, they can include: Fatigue and weakness. Weight loss. Confusion. Nausea, vomiting, or abdominal pain. Yellowing of your skin and the white parts of your eyes (jaundice). Itchy skin. How is this diagnosed? This condition may be diagnosed by: A physical exam and your medical history. Blood tests. Imaging tests, such as an ultrasound, CT scan, or MRI. A liver biopsy. A small sample of liver tissue is removed using a needle. The sample is then looked at under a microscope. How is this treated? Fatty liver disease is often caused by other health conditions. Treatment for fatty liver may involve medicines and lifestyle changes to manage conditions such as: Alcoholism. High cholesterol. Diabetes. Being overweight or obese. Follow these instructions at home:  Do not drink alcohol. If you have trouble quitting, ask your health care provider how to safely quit with the help of medicine or a supervised program. This is important to keep your condition from getting worse. Eat a healthy diet as told by your health care provider. Ask your health care provider about working with a dietitian to develop an eating plan. Exercise regularly. This can help you lose weight and control your cholesterol and diabetes. Talk to your health care provider about an exercise plan and which activities are best for you. Take over-the-counter and prescription medicines only as told by your health care provider. Keep all follow-up visits. This is important. Contact a health care provider if: You have  trouble controlling your: Blood sugar. This is especially important if you have diabetes. Cholesterol. Drinking of alcohol. Get help right away if: You have  abdominal pain. You have jaundice. You have nausea and are vomiting. You vomit blood or material that looks like coffee grounds. You have stools that are black, tar-like, or bloody. Summary Fatty liver disease develops when too much fat builds up in the cells of your liver. Fatty liver disease often causes no symptoms or problems. However, over time, fatty liver can cause inflammation that may lead to more serious liver problems, such as scarring of the liver (cirrhosis). You are more likely to develop this condition if you abuse alcohol, are pregnant, are overweight, have diabetes, have hepatitis, or have high triglyceride or cholesterol levels. Contact your health care provider if you have trouble controlling your blood sugar, cholesterol, or drinking of alcohol. This information is not intended to replace advice given to you by your health care provider. Make sure you discuss any questions you have with your health care provider. Document Revised: 05/28/2020 Document Reviewed: 05/28/2020 Elsevier Patient Education  2023 [employer]   Nonalcoholic Fatty Liver Disease Diet, Adult Nonalcoholic fatty liver disease is a condition that causes fat to build up in and around the liver. The disease makes it harder for the liver to work the way that it should. Following a healthy diet can help to keep nonalcoholic fatty liver disease under control. It can also help to prevent or improve conditions that are associated with the disease, such as heart disease, diabetes, high blood pressure, and abnormal cholesterol levels. Along with regular exercise, this diet: Promotes weight loss. Helps to control blood sugar levels. Helps to improve the way that the body uses insulin. What are tips for following this plan? Reading food labels Always check food labels for: The amount of saturated fat in a food. You should limit your intake of saturated fat. Saturated fat is found in foods that come from animals,  including meat and dairy products such as butter, cheese, and whole milk. The amount of fiber in a food. You should choose high-fiber foods such as fruits, vegetables, and whole grains. Try to get 25-30 grams (g) of fiber a day.  Cooking When cooking, use heart-healthy oils that are high in monounsaturated fats. These include olive oil, canola oil, and avocado oil. Limit frying or deep-frying foods. Cook foods using healthy methods such as baking, boiling, steaming, and grilling instead. Meal planning You may want to keep track of how many calories you take in. Eating the right amount of calories will help you achieve a healthy weight. Meeting with a registered dietitian can help you get started. Limit how often you eat takeout and fast food. These foods are usually very high in fat, salt, and sugar. Use the glycemic index (GI) to plan your meals. The index tells you how quickly a food will raise your blood sugar. Choose low-GI foods (GI less than 55). These foods take a longer time to raise blood sugar. A registered dietitian can help you identify foods lower on the GI scale. Lifestyle You may want to follow a Mediterranean diet. This diet includes a lot of vegetables, lean meats or fish, whole grains, fruits, and healthy oils and fats. What foods can I eat?  Fruits Bananas. Apples. Oranges. Grapes. Papaya. Mango. Pomegranate. Kiwi. Grapefruit. Cherries. Vegetables Lettuce. Spinach. Peas. Beets. Cauliflower. Cabbage. Broccoli. Carrots. Tomatoes. Squash. Eggplant. Herbs. Peppers. Onions. Cucumbers. Brussels sprouts. Yams and sweet potatoes. Beans. Lentils.  Grains Whole wheat or whole-grain foods, including breads, crackers, cereals, and pasta. Stone-ground whole wheat. Unsweetened oatmeal. Bulgur. Barley. Quinoa. Brown or wild rice. Corn or whole wheat flour tortillas. Meats and other proteins Lean meats. Poultry. Tofu. Seafood and shellfish. Dairy Low-fat or fat-free dairy products, such as  yogurt, cottage cheese, or cheese. Beverages Water. Sugar-free drinks. Tea. Coffee. Low-fat or skim milk. Milk alternatives, such as soy or almond milk. Real fruit juice. Fats and oils Avocado. Canola or olive oil. Nuts and nut butters. Seeds. Seasonings and condiments Mustard. Relish. Low-fat, low-sugar ketchup and barbecue sauce. Low-fat or fat-free mayonnaise. Sweets and desserts Sugar-free sweets. The items listed above may not be a complete list of foods and beverages you can eat. Contact a dietitian for more information. What foods should I limit or avoid? Meats and other proteins Limit red meat to 1-2 times a week. Dairy [employer]. Fats and oils Palm oil and coconut oil. Fried foods. Other foods Processed foods. Foods that contain a lot of salt or sodium. Sweets and desserts Sweets that contain sugar. Beverages Sweetened drinks, such as sweet tea, milkshakes, iced sweet drinks, and sodas. Alcohol. The items listed above may not be a complete list of foods and beverages you should avoid. Contact a dietitian for more information. Where to find more information The [employer] of Diabetes and Digestive and Kidney Diseases: [email or web address] Summary Nonalcoholic fatty liver disease is a condition that causes fat to build up in and around the liver. Following a healthy diet can help to keep nonalcoholic fatty liver disease under control. Your diet should be rich in fruits, vegetables, whole grains, and lean proteins. Limit your intake of saturated fat. Saturated fat is found in foods that come from animals, including meat and dairy products such as butter, cheese, and whole milk. This diet promotes weight loss, helps to control blood sugar levels, and helps to improve the way that the body uses insulin. This information is not intended to replace advice given to you by your health care provider. Make sure you discuss any questions you have with your health care  provider. Document Revised: 12/07/2018 Document Reviewed: 09/06/2018 Elsevier Patient Education  2023 [employer] Inc.    Gastroesophageal Reflux Disease, Adult Gastroesophageal reflux (GER) happens when acid from the stomach flows up into the tube that connects the mouth and the stomach (esophagus). Normally, food travels down the esophagus and stays in the stomach to be digested. However, when a person has GER, food and stomach acid sometimes move back up into the esophagus. If this becomes a more serious problem, the person may be diagnosed with a disease called gastroesophageal reflux disease (GERD). GERD occurs when the reflux: Happens often. Causes frequent or severe symptoms. Causes problems such as damage to the esophagus. When stomach acid comes in contact with the esophagus, the acid may cause inflammation in the esophagus. Over time, GERD may create small holes (ulcers) in the lining of the esophagus. What are the causes? This condition is caused by a problem with the muscle between the esophagus and the stomach (lower esophageal sphincter, or LES). Normally, the LES muscle closes after food passes through the esophagus to the stomach. When the LES is weakened or abnormal, it does not close properly, and that allows food and stomach acid to go back up into the esophagus. The LES can be weakened by certain dietary substances, medicines, and medical conditions, including: Tobacco use. Pregnancy. Having a hiatal hernia. Alcohol use. Certain foods and beverages, such  as coffee, chocolate, onions, and peppermint. What increases the risk? You are more likely to develop this condition if you: Have an increased body weight. Have a connective tissue disorder. Take NSAIDs, such as ibuprofen. What are the signs or symptoms? Symptoms of this condition include: Heartburn. Difficult or painful swallowing and the feeling of having a lump in the throat. A bitter taste in the mouth. Bad breath  and having a large amount of saliva. Having an upset or bloated stomach and belching. Chest pain. Different conditions can cause chest pain. Make sure you see your health care provider if you experience chest pain. Shortness of breath or wheezing. Ongoing (chronic) cough or a nighttime cough. Wearing away of tooth enamel. Weight loss. How is this diagnosed? This condition may be diagnosed based on a medical history and a physical exam. To determine if you have mild or severe GERD, your health care provider may also monitor how you respond to treatment. You may also have tests, including: A test to examine your stomach and esophagus with a small camera (endoscopy). A test that measures the acidity level in your esophagus. A test that measures how much pressure is on your esophagus. A barium swallow or modified barium swallow test to show the shape, size, and functioning of your esophagus. How is this treated? Treatment for this condition may vary depending on how severe your symptoms are. Your health care provider may recommend: Changes to your diet. Medicine. Surgery. The goal of treatment is to help relieve your symptoms and to prevent complications. Follow these instructions at home: Eating and drinking  Follow a diet as recommended by your health care provider. This may involve avoiding foods and drinks such as: Coffee and tea, with or without caffeine. Drinks that contain alcohol. Energy drinks and sports drinks. Carbonated drinks or sodas. Chocolate and cocoa. Peppermint and mint flavorings. Garlic and onions. Horseradish. Spicy and acidic foods, including peppers, chili powder, curry powder, vinegar, hot sauces, and barbecue sauce. Citrus fruit juices and citrus fruits, such as oranges, lemons, and limes. Tomato-based foods, such as red sauce, chili, salsa, and pizza with red sauce. Fried and fatty foods, such as donuts, french fries, potato chips, and high-fat  dressings. High-fat meats, such as hot dogs and fatty cuts of red and white meats, such as rib eye steak, sausage, ham, and bacon. High-fat dairy items, such as whole milk, butter, and cream cheese. Eat small, frequent meals instead of large meals. Avoid drinking large amounts of liquid with your meals. Avoid eating meals during the 2-3 hours before bedtime. Avoid lying down right after you eat. Do not exercise right after you eat. Lifestyle  Do not use any products that contain nicotine or tobacco. These products include cigarettes, chewing tobacco, and vaping devices, such as e-cigarettes. If you need help quitting, ask your health care provider. Try to reduce your stress by using methods such as yoga or meditation. If you need help reducing stress, ask your health care provider. If you are overweight, reduce your weight to an amount that is healthy for you. Ask your health care provider for guidance about a safe weight loss goal. General instructions Pay attention to any changes in your symptoms. Take over-the-counter and prescription medicines only as told by your health care provider. Do not take aspirin, ibuprofen, or other NSAIDs unless your health care provider told you to take these medicines. Wear loose-fitting clothing. Do not wear anything tight around your waist that causes pressure on your abdomen. Raise (  elevate) the head of your bed about 6 inches (15 cm). You can use a wedge to do this. Avoid bending over if this makes your symptoms worse. Keep all follow-up visits. This is important. Contact a health care provider if: You have: New symptoms. Unexplained weight loss. Difficulty swallowing or it hurts to swallow. Wheezing or a persistent cough. A hoarse voice. Your symptoms do not improve with treatment. Get help right away if: You have sudden pain in your arms, neck, jaw, teeth, or back. You suddenly feel sweaty, dizzy, or light-headed. You have chest pain or shortness  of breath. You vomit and the vomit is green, yellow, or black, or it looks like blood or coffee grounds. You faint. You have stool that is red, bloody, or black. You cannot swallow, drink, or eat. These symptoms may represent a serious problem that is an emergency. Do not wait to see if the symptoms will go away. Get medical help right away. Call your local emergency services (911 in the [place] Do not drive yourself to the hospital. Summary Gastroesophageal reflux happens when acid from the stomach flows up into the esophagus. GERD is a disease in which the reflux happens often, causes frequent or severe symptoms, or causes problems such as damage to the esophagus. Treatment for this condition may vary depending on how severe your symptoms are. Your health care provider may recommend diet and lifestyle changes, medicine, or surgery. Contact a health care provider if you have new or worsening symptoms. Take over-the-counter and prescription medicines only as told by your health care provider. Do not take aspirin, ibuprofen, or other NSAIDs unless your health care provider told you to do so. Keep all follow-up visits as told by your health care provider. This is important. This information is not intended to replace advice given to you by your health care provider. Make sure you discuss any questions you have with your health care provider. Document Revised: 02/24/2020 Document Reviewed: 02/24/2020 Elsevier Patient Education  2023 Else[employer] Inc.   Food Choices for Gastroesophageal Reflux Disease, Adult When you have gastroesophageal reflux disease (GERD), the foods you eat and your eating habits are very important. Choosing the right foods can help ease the discomfort of GERD. Consider working with a dietitian to help you make healthy food choices. What are tips for following this plan? Reading food labels Look for foods that are low in saturated fat. Foods that have less than 5% of daily value  (DV) of fat and 0 g of trans fats may help with your symptoms. Cooking Cook foods using methods other than frying. This may include baking, steaming, grilling, or broiling. These are all methods that do not need a lot of fat for cooking. To add flavor, try to use herbs that are low in spice and acidity. Meal planning  Choose healthy foods that are low in fat, such as fruits, vegetables, whole grains, low-fat dairy products, lean meats, fish, and poultry. Eat frequent, small meals instead of three large meals each day. Eat your meals slowly, in a relaxed setting. Avoid bending over or lying down until 2-3 hours after eating. Limit high-fat foods such as fatty meats or fried foods. Limit your intake of fatty foods, such as oils, butter, and shortening. Avoid the following as told by your health care provider: Foods that cause symptoms. These may be different for different people. Keep a food diary to keep track of foods that cause symptoms. Alcohol. Drinking large amounts of liquid with meals. Eating  meals during the 2-3 hours before bed. Lifestyle Maintain a healthy weight. Ask your health care provider what weight is healthy for you. If you need to lose weight, work with your health care provider to do so safely. Exercise for at least 30 minutes on 5 or more days each week, or as told by your health care provider. Avoid wearing clothes that fit tightly around your waist and chest. Do not use any products that contain nicotine or tobacco. These products include cigarettes, chewing tobacco, and vaping devices, such as e-cigarettes. If you need help quitting, ask your health care provider. Sleep with the head of your bed raised. Use a wedge under the mattress or blocks under the bed frame to raise the head of the bed. Chew sugar-free gum after mealtimes. What foods should I eat?  Eat a healthy, well-balanced diet of fruits, vegetables, whole grains, low-fat dairy products, lean meats, fish, and  poultry. Each person is different. Foods that may trigger symptoms in one person may not trigger any symptoms in another person. Work with your health care provider to identify foods that are safe for you. The items listed above may not be a complete list of recommended foods and beverages. Contact a dietitian for more information. What foods should I avoid? Limiting some of these foods may help manage the symptoms of GERD. Everyone is different. Consult a dietitian or your health care provider to help you identify the exact foods to avoid, if any. Fruits Any fruits prepared with added fat. Any fruits that cause symptoms. For some people this may include citrus fruits, such as oranges, grapefruit, pineapple, and lemons. Vegetables Deep-fried vegetables. [place] fries. Any vegetables prepared with added fat. Any vegetables that cause symptoms. For some people, this may include tomatoes and tomato products, chili peppers, onions and garlic, and horseradish. Grains Pastries or quick breads with added fat. Meats and other proteins High-fat meats, such as fatty beef or pork, hot dogs, ribs, ham, sausage, salami, and bacon. Fried meat or protein, including fried fish and fried chicken. Nuts and nut butters, in large amounts. Dairy Whole milk and chocolate milk. Sour cream. Cream. Ice cream. Cream cheese. Milkshakes. Fats and oils Butter. Margarine. Shortening. Ghee. Beverages Coffee and tea, with or without caffeine. Carbonated beverages. Sodas. Energy drinks. Fruit juice made with acidic fruits, such as orange or grapefruit. Tomato juice. Alcoholic drinks. Sweets and desserts Chocolate and cocoa. Donuts. Seasonings and condiments Pepper. Peppermint and spearmint. Added salt. Any condiments, herbs, or seasonings that cause symptoms. For some people, this may include curry, hot sauce, or vinegar-based salad dressings. The items listed above may not be a complete list of foods and beverages to avoid.  Contact a dietitian for more information. Questions to ask your health care provider Diet and lifestyle changes are usually the first steps that are taken to manage symptoms of GERD. If diet and lifestyle changes do not improve your symptoms, talk with your health care provider about taking medicines. Where to find more information [employer] Gastrointestinal [employer]: aboutgerd.org Summary When you have gastroesophageal reflux disease (GERD), food and lifestyle choices may be very helpful in easing the discomfort of GERD. Eat frequent, small meals instead of three large meals each day. Eat your meals slowly, in a relaxed setting. Avoid bending over or lying down until 2-3 hours after eating. Limit high-fat foods such as fatty meats or fried foods. This information is not intended to replace advice given to you by your health care provider.  Make sure you discuss any questions you have with your health care provider. Document Revised: 02/24/2020 Document Reviewed: 02/24/2020 Elsevier Patient Education  [place].

## 2022-03-27 DIAGNOSIS — Z1211 Encounter for screening for malignant neoplasm of colon: Secondary | ICD-10-CM | POA: Diagnosis not present

## 2022-04-05 LAB — COLOGUARD: COLOGUARD: NEGATIVE

## 2022-09-16 ENCOUNTER — Ambulatory Visit (INDEPENDENT_AMBULATORY_CARE_PROVIDER_SITE_OTHER): Payer: 59 | Admitting: Gastroenterology

## 2022-09-16 ENCOUNTER — Encounter: Payer: Self-pay | Admitting: Gastroenterology

## 2022-09-16 VITALS — BP 124/83 | HR 76 | Temp 98.1°F | Ht 67.0 in | Wt 178.6 lb

## 2022-09-16 DIAGNOSIS — K219 Gastro-esophageal reflux disease without esophagitis: Secondary | ICD-10-CM | POA: Diagnosis not present

## 2022-09-16 DIAGNOSIS — K760 Fatty (change of) liver, not elsewhere classified: Secondary | ICD-10-CM | POA: Diagnosis not present

## 2022-09-16 NOTE — Progress Notes (Addendum)
GI Office Note    Referring Provider: Adaline Sill, NP Primary Care Physician:  Adaline Sill, NP  Primary Gastroenterologist: Garfield Cornea, MD   Chief Complaint   Chief Complaint  Patient presents with   Follow-up    Doing well. No issues.    History of Present Illness   Elizabeth Krueger is a 57 y.o. female presenting today for follow up GERD, IBS, hepatic steatosis. Last seen in 02/2022.   Overall doing well. Her reflux is well controlled. No dysphagia, n/v, abdominal pain. She has lost 30 pounds since her last OV. Down from 240 at her heaviest. She thought about trying to come off her lansoprazole but she has been afraid to try. BMs regular for most part but does have intermittent diarrhea, seems to be worse with artificial sweeteners. No melena, brbpr. She has never had her cholesterol checked. Does not routinely have a physicial.   Cologuard test negative in 02/2022.  No prior EGD.        Medications   Current Outpatient Medications  Medication Sig Dispense Refill   lansoprazole (PREVACID) 30 MG capsule Take 1 capsule (30 mg total) by mouth 2 (two) times daily before a meal. 180 capsule 3   UNABLE TO FIND Med Name: Jacelyn Pi 0.1mg  daily     No current facility-administered medications for this visit.    Allergies   Allergies as of 09/16/2022   (No Known Allergies)     Past Medical History   Past Medical History:  Diagnosis Date   Asthma    Facial tic    GERD (gastroesophageal reflux disease)    IBS (irritable bowel syndrome)     Past Surgical History   Past Surgical History:  Procedure Laterality Date   CESAREAN SECTION      Past Family History   Family History  Problem Relation Age of Onset   Prostate cancer Father    Diabetes Father    Heart disease Father    COPD Father    Rheum arthritis Father    Diabetes Mother    Stomach cancer Maternal Grandmother    Liver cancer Paternal Grandfather    Colon cancer Neg Hx     Esophageal cancer Neg Hx     Past Social History   Social History   Socioeconomic History   Marital status: Married    Spouse name: Not on file   Number of children: Not on file   Years of education: Not on file   Highest education level: Not on file  Occupational History   Not on file  Tobacco Use   Smoking status: Former    Packs/day: 0.10    Years: 2.00    Total pack years: 0.20    Types: Pipe, Cigarettes    Quit date: 08/29/2000    Years since quitting: 22.0   Smokeless tobacco: Never  Vaping Use   Vaping Use: Never used  Substance and Sexual Activity   Alcohol use: Yes    Comment: occasionally   Drug use: No   Sexual activity: Yes  Other Topics Concern   Not on file  Social History Narrative   Married, lives with spouse   1 child   3 cans of caffeine daily   Financial planner   Social Determinants of Health   Financial Resource Strain: Not on file  Food Insecurity: Not on file  Transportation Needs: Not on file  Physical Activity: Not on file  Stress: Not on file  Social Connections: Not on file  Intimate Partner Violence: Not on file    Review of Systems   General: Negative for anorexia, weight loss, fever, chills, fatigue, weakness. ENT: Negative for hoarseness, difficulty swallowing , nasal congestion. CV: Negative for chest pain, angina, palpitations, dyspnea on exertion, peripheral edema.  Respiratory: Negative for dyspnea at rest, dyspnea on exertion, cough, sputum, wheezing.  GI: See history of present illness. GU:  Negative for dysuria, hematuria, urinary incontinence, urinary frequency, nocturnal urination.  Endo: Negative for unusual weight change.     Physical Exam   BP 124/83 (BP Location: Right Arm, Patient Position: Sitting, Cuff Size: Large)   Pulse 76   Temp 98.1 F (36.7 C) (Oral)   Ht 5\' 7"  (1.702 m)   Wt 178 lb 9.6 oz (81 kg)   LMP 08/26/2022   SpO2 98%   BMI 27.97 kg/m    General: Well-nourished, well-developed  in no acute distress.  Eyes: No icterus. Mouth: Oropharyngeal mucosa moist and pink  Lungs: Clear to auscultation bilaterally.  Heart: Regular rate and rhythm, no murmurs rubs or gallops.  Abdomen: Bowel sounds are normal, nontender, nondistended, no hepatosplenomegaly or masses,  no abdominal bruits or hernia , no rebound or guarding.  Rectal: not performed  Extremities: No lower extremity edema. No clubbing or deformities. Neuro: Alert and oriented x 4   Skin: Warm and dry, no jaundice.   Psych: Alert and cooperative, normal mood and affect.  Labs   Lab Results  Component Value Date   CREATININE 0.98 01/05/2022   BUN 22 (H) 01/05/2022   NA 137 01/05/2022   K 4.0 01/05/2022   CL 106 01/05/2022   CO2 20 (L) 01/05/2022   Lab Results  Component Value Date   ALT 25 01/05/2022   AST 16 01/05/2022   ALKPHOS 53 01/05/2022   BILITOT 0.6 01/05/2022   Lab Results  Component Value Date   WBC 16.6 (H) 01/05/2022   HGB 15.0 01/05/2022   HCT 44.9 01/05/2022   MCV 92.4 01/05/2022   PLT 259 01/05/2022   Lab Results  Component Value Date   LIPASE 24 01/05/2022    Imaging Studies   No results found.  Assessment   GERD: doing well on PPI. She has never had an EGD. Having GERD on PPI for over 10 years, age over 43, would advise screening EGD for Barrett's.   Fatty liver: weight reduction as outlined. Last imaging in 2018. Her LFTs have been normal. We will update labs at this time including baseline lipid panel. Consider updating baseline u/s next visit unless LFTs elevated at which time we may do sooner.   PLAN   Lipid profile. LFTs Continue lansoprazole 30mg , try cutting back to every other day.  Upper endoscopy with Dr. Gala Romney. ASA 2.  I have discussed the risks, alternatives, benefits with regards to but not limited to the risk of reaction to medication, bleeding, infection, perforation and the patient is agreeable to proceed. Written consent to be obtained.     Laureen Ochs. Bobby Rumpf, [place], [facility]

## 2022-09-16 NOTE — Patient Instructions (Signed)
Please complete labs today. Upper endoscopy to be scheduled. Continue lansoprazole, try cutting back to every other day to see if you tolerate reduced dose.  Congratulations on your weight loss!!!  It was a pleasure to see you today. I want to create trusting relationships with patients and provide genuine, compassionate, and quality care. I truly value your feedback, so please be on the lookout for a survey regarding your visit with me today. I appreciate your time in completing this!

## 2022-09-17 LAB — LIPID PANEL
Chol/HDL Ratio: 5.1 ratio — ABNORMAL HIGH (ref 0.0–4.4)
Cholesterol, Total: 230 mg/dL — ABNORMAL HIGH (ref 100–199)
HDL: 45 mg/dL (ref >39)
LDL Chol Calc (NIH): 172 mg/dL — ABNORMAL HIGH (ref 0–99)
Triglycerides: 76 mg/dL (ref 0–149)
VLDL Cholesterol Cal: 13 mg/dL (ref 5–40)

## 2022-09-17 LAB — HEPATIC FUNCTION PANEL
ALT: 27 IU/L (ref 0–32)
AST: 14 IU/L (ref 0–40)
Albumin: 4.2 g/dL (ref 3.8–4.9)
Alkaline Phosphatase: 79 IU/L (ref 44–121)
Bilirubin Total: 0.3 mg/dL (ref 0.0–1.2)
Bilirubin, Direct: <0.1 mg/dL (ref 0.00–0.40)
Total Protein: 6.8 g/dL (ref 6.0–8.5)

## 2022-09-19 ENCOUNTER — Telehealth (INDEPENDENT_AMBULATORY_CARE_PROVIDER_SITE_OTHER): Payer: Self-pay | Admitting: *Deleted

## 2022-09-19 NOTE — Telephone Encounter (Signed)
Patient wanted to wait until March to schedule with Dr. Gala Romney, EGD, ASA 2. Aware will call once we receive that schedule

## 2022-10-10 ENCOUNTER — Telehealth: Payer: Self-pay | Admitting: *Deleted

## 2022-10-10 NOTE — Telephone Encounter (Signed)
Called to schedule EGD w/Dr.Rourk, ASA 2 and pt would like to wait just as long as she gets it done before August and prefers a Friday. Advised pt will call her once we get providers schedule.

## 2022-10-28 DIAGNOSIS — R87619 Unspecified abnormal cytological findings in specimens from cervix uteri: Secondary | ICD-10-CM | POA: Diagnosis not present

## 2022-10-28 DIAGNOSIS — Z01419 Encounter for gynecological examination (general) (routine) without abnormal findings: Secondary | ICD-10-CM | POA: Diagnosis not present

## 2022-10-28 DIAGNOSIS — Z1389 Encounter for screening for other disorder: Secondary | ICD-10-CM | POA: Diagnosis not present

## 2022-10-28 DIAGNOSIS — Z79890 Hormone replacement therapy (postmenopausal): Secondary | ICD-10-CM | POA: Diagnosis not present

## 2022-10-28 DIAGNOSIS — Z1231 Encounter for screening mammogram for malignant neoplasm of breast: Secondary | ICD-10-CM | POA: Diagnosis not present

## 2022-11-07 NOTE — Telephone Encounter (Signed)
Called pt to schedule in April and the 2 Friday dates did not work.She stated May did not look good either. She would like a call with July/Aug schedule. Manuela Schwartz, add to recall for July please. Thanks!

## 2023-01-27 NOTE — Telephone Encounter (Signed)
Called pt. She stated she is not having any issues and does not feel like she needs it done right now. She wants to know if this could wait until possibly next year. She is due to follow in January 2025 and could maybe talk about it then? Please advise leslie thanks

## 2023-01-29 NOTE — Telephone Encounter (Signed)
Can discuss in 08/2023.

## 2023-01-30 NOTE — Telephone Encounter (Signed)
Noted. Called pt and made her aware.

## 2023-02-03 DIAGNOSIS — N951 Menopausal and female climacteric states: Secondary | ICD-10-CM | POA: Diagnosis not present

## 2023-02-03 DIAGNOSIS — R8769 Abnormal cytological findings in specimens from other female genital organs: Secondary | ICD-10-CM | POA: Diagnosis not present

## 2023-02-13 ENCOUNTER — Encounter (HOSPITAL_COMMUNITY): Admission: RE | Payer: Self-pay | Source: Home / Self Care

## 2023-02-13 ENCOUNTER — Ambulatory Visit (HOSPITAL_COMMUNITY): Admission: RE | Admit: 2023-02-13 | Payer: 59 | Source: Home / Self Care | Admitting: Specialist

## 2023-02-13 SURGERY — SHOULDER ARTHROSCOPY WITH SUBACROMIAL DECOMPRESSION AND BICEP TENDON REPAIR
Anesthesia: Choice | Laterality: Left

## 2023-03-29 ENCOUNTER — Other Ambulatory Visit: Payer: Self-pay | Admitting: Gastroenterology

## 2023-03-29 DIAGNOSIS — K219 Gastro-esophageal reflux disease without esophagitis: Secondary | ICD-10-CM

## 2023-07-07 DIAGNOSIS — Z13228 Encounter for screening for other metabolic disorders: Secondary | ICD-10-CM | POA: Diagnosis not present

## 2023-07-20 DIAGNOSIS — L280 Lichen simplex chronicus: Secondary | ICD-10-CM | POA: Diagnosis not present

## 2023-07-20 DIAGNOSIS — L814 Other melanin hyperpigmentation: Secondary | ICD-10-CM | POA: Diagnosis not present

## 2023-07-20 DIAGNOSIS — L738 Other specified follicular disorders: Secondary | ICD-10-CM | POA: Diagnosis not present

## 2023-07-20 DIAGNOSIS — L820 Inflamed seborrheic keratosis: Secondary | ICD-10-CM | POA: Diagnosis not present

## 2023-07-20 DIAGNOSIS — D2239 Melanocytic nevi of other parts of face: Secondary | ICD-10-CM | POA: Diagnosis not present

## 2023-07-20 DIAGNOSIS — D485 Neoplasm of uncertain behavior of skin: Secondary | ICD-10-CM | POA: Diagnosis not present

## 2023-08-03 ENCOUNTER — Encounter: Payer: Self-pay | Admitting: Gastroenterology

## 2023-11-22 DIAGNOSIS — Z8261 Family history of arthritis: Secondary | ICD-10-CM | POA: Diagnosis not present

## 2023-11-22 DIAGNOSIS — R509 Fever, unspecified: Secondary | ICD-10-CM | POA: Diagnosis not present

## 2023-11-22 DIAGNOSIS — I10 Essential (primary) hypertension: Secondary | ICD-10-CM | POA: Diagnosis not present

## 2023-11-22 DIAGNOSIS — R7309 Other abnormal glucose: Secondary | ICD-10-CM | POA: Diagnosis not present

## 2023-11-22 DIAGNOSIS — M2550 Pain in unspecified joint: Secondary | ICD-10-CM | POA: Diagnosis not present

## 2024-04-15 ENCOUNTER — Other Ambulatory Visit: Payer: Self-pay | Admitting: Gastroenterology

## 2024-04-15 DIAGNOSIS — K219 Gastro-esophageal reflux disease without esophagitis: Secondary | ICD-10-CM

## 2024-04-18 ENCOUNTER — Telehealth: Payer: Self-pay | Admitting: Gastroenterology

## 2024-04-18 NOTE — Telephone Encounter (Signed)
 Call pt to make OV pt states she is trying to come off of medications given, but made appointment for the beginning of the year. Pt states she is having no issues right now.

## 2024-05-09 ENCOUNTER — Encounter (HOSPITAL_COMMUNITY): Payer: Self-pay | Admitting: Emergency Medicine

## 2024-05-09 ENCOUNTER — Other Ambulatory Visit: Payer: Self-pay

## 2024-05-09 ENCOUNTER — Emergency Department (HOSPITAL_COMMUNITY)

## 2024-05-09 ENCOUNTER — Observation Stay (HOSPITAL_COMMUNITY)
Admission: EM | Admit: 2024-05-09 | Discharge: 2024-05-10 | Disposition: A | Discharge status: Home / Self Care | Source: Ambulatory Visit | Attending: Family Medicine | Admitting: Family Medicine

## 2024-05-09 DIAGNOSIS — Z7982 Long term (current) use of aspirin: Secondary | ICD-10-CM | POA: Diagnosis not present

## 2024-05-09 DIAGNOSIS — G4733 Obstructive sleep apnea (adult) (pediatric): Secondary | ICD-10-CM | POA: Diagnosis not present

## 2024-05-09 DIAGNOSIS — R002 Palpitations: Secondary | ICD-10-CM | POA: Diagnosis present

## 2024-05-09 DIAGNOSIS — I10 Essential (primary) hypertension: Secondary | ICD-10-CM | POA: Diagnosis not present

## 2024-05-09 DIAGNOSIS — Z7401 Bed confinement status: Secondary | ICD-10-CM | POA: Diagnosis not present

## 2024-05-09 DIAGNOSIS — R079 Chest pain, unspecified: Secondary | ICD-10-CM | POA: Diagnosis not present

## 2024-05-09 DIAGNOSIS — Z79899 Other long term (current) drug therapy: Secondary | ICD-10-CM | POA: Diagnosis not present

## 2024-05-09 DIAGNOSIS — K219 Gastro-esophageal reflux disease without esophagitis: Secondary | ICD-10-CM | POA: Insufficient documentation

## 2024-05-09 DIAGNOSIS — Z6831 Body mass index (BMI) 31.0-31.9, adult: Secondary | ICD-10-CM | POA: Diagnosis not present

## 2024-05-09 DIAGNOSIS — E66811 Obesity, class 1: Secondary | ICD-10-CM | POA: Diagnosis not present

## 2024-05-09 LAB — BASIC METABOLIC PANEL WITH GFR
Anion gap: 10 (ref 5–15)
BUN: 17 mg/dL (ref 6–20)
CO2: 22 mmol/L (ref 22–32)
Calcium: 9.5 mg/dL (ref 8.9–10.3)
Chloride: 106 mmol/L (ref 98–111)
Creatinine, Ser: 0.87 mg/dL (ref 0.44–1.00)
GFR, Estimated: >60 mL/min (ref >60)
Glucose, Bld: 98 mg/dL (ref 70–99)
Potassium: 4.6 mmol/L (ref 3.5–5.1)
Sodium: 138 mmol/L (ref 135–145)

## 2024-05-09 LAB — CBC
HCT: 42.1 % (ref 36.0–46.0)
Hemoglobin: 14.1 g/dL (ref 12.0–15.0)
MCH: 30.7 pg (ref 26.0–34.0)
MCHC: 33.5 g/dL (ref 30.0–36.0)
MCV: 91.7 fL (ref 80.0–100.0)
Platelets: 227 K/uL (ref 150–400)
RBC: 4.59 MIL/uL (ref 3.87–5.11)
RDW: 12.5 % (ref 11.5–15.5)
WBC: 6.8 K/uL (ref 4.0–10.5)
nRBC: 0 % (ref 0.0–0.2)

## 2024-05-09 LAB — TROPONIN I (HIGH SENSITIVITY)
Troponin I (High Sensitivity): <2 ng/L (ref <18)
Troponin I (High Sensitivity): <2 ng/L (ref <18)

## 2024-05-09 LAB — D-DIMER, QUANTITATIVE: D-Dimer, Quant: 0.35 ug{FEU}/mL (ref 0.00–0.50)

## 2024-05-09 LAB — CBG MONITORING, ED: Glucose-Capillary: 97 mg/dL (ref 70–99)

## 2024-05-09 MED ORDER — NITROGLYCERIN 0.4 MG SL SUBL
0.4000 mg | SUBLINGUAL_TABLET | SUBLINGUAL | Status: DC | PRN
  Filled 2024-05-09: qty 1

## 2024-05-09 MED ORDER — FENTANYL CITRATE (PF) 100 MCG/2ML IJ SOLN: 25.0000 ug | Freq: Once | INTRAMUSCULAR | Status: DC

## 2024-05-09 MED ORDER — ACETAMINOPHEN 500 MG PO TABS
1000.0000 mg | ORAL_TABLET | Freq: Once | ORAL | Status: AC
  Administered 2024-05-09: 1000 mg via ORAL
  Filled 2024-05-09: qty 2

## 2024-05-09 MED ORDER — ACETAMINOPHEN 325 MG PO TABS
650.0000 mg | ORAL_TABLET | ORAL | Status: DC | PRN
  Administered 2024-05-09 – 2024-05-10 (×2): 650 mg via ORAL
  Filled 2024-05-09 (×2): qty 2

## 2024-05-09 MED ORDER — NITROGLYCERIN 0.4 MG SL SUBL
0.4000 mg | SUBLINGUAL_TABLET | Freq: Once | SUBLINGUAL | Status: AC
Start: 2024-05-09 — End: 2024-05-09
  Administered 2024-05-09: 0.4 mg via SUBLINGUAL
  Filled 2024-05-09: qty 1

## 2024-05-09 MED ORDER — ONDANSETRON HCL 4 MG/2ML IJ SOLN: 4.0000 mg | Freq: Four times a day (QID) | INTRAMUSCULAR | Status: DC | PRN

## 2024-05-09 MED ORDER — HEPARIN SODIUM (PORCINE) 5000 UNIT/ML IJ SOLN: 5000.0000 [IU] | Freq: Three times a day (TID) | INTRAMUSCULAR | Status: DC

## 2024-05-09 MED ORDER — ASPIRIN 81 MG PO TBEC
81.0000 mg | DELAYED_RELEASE_TABLET | Freq: Every day | ORAL | Status: DC
  Administered 2024-05-09: 81 mg via ORAL
  Filled 2024-05-09 (×2): qty 1

## 2024-05-09 MED ORDER — AMLODIPINE BESYLATE 5 MG PO TABS
5.0000 mg | ORAL_TABLET | Freq: Every day | ORAL | Status: DC
  Administered 2024-05-09: 5 mg via ORAL
  Filled 2024-05-09 (×2): qty 1

## 2024-05-09 MED ORDER — BUTALBITAL-APAP-CAFFEINE 50-325-40 MG PO TABS
1.0000 | ORAL_TABLET | Freq: Once | ORAL | Status: AC
Start: 2024-05-09 — End: 2024-05-09
  Administered 2024-05-09: 1 via ORAL
  Filled 2024-05-09: qty 1

## 2024-05-09 MED ORDER — FENTANYL CITRATE (PF) 100 MCG/2ML IJ SOLN
50.0000 ug | Freq: Once | INTRAMUSCULAR | Status: AC
  Administered 2024-05-09: 50 ug via INTRAVENOUS
  Filled 2024-05-09: qty 2

## 2024-05-09 NOTE — ED Notes (Signed)
 Pt walked to bathroom with assistance. Pt stated that she started to get winded before we got back to the room. Pt states it's not normal for pt to get winded and normally walks a mile a day. Md notified.

## 2024-05-09 NOTE — Progress Notes (Signed)
 Notified cardmaster of plan for patient to transfer to [facility] on IM service, cardiology will need to follow there. Also tagged cardiac img navigator team but asked cardmaster to have rounding team touch base with them in AM to ensure cardiac CT a go. Care order placed for 18g IV in the [facility] as well as beta blocker pre-med prior to cor CTA (Dr. Mallipeddi recommends metoprolol  100mg  x1, ordered in protocol).

## 2024-05-09 NOTE — ED Provider Notes (Signed)
 [facility]PART[facility] AT [facility] Provider Note   CSN: [id number] Arrival date & time: 05/09/24  1010     Patient presents with: Palpitations   Elizabeth Krueger is a 58 y.o. female.  She has a history of hypertension, but states she had gone off her medication when she lost weight and never restarted or went back to the doctor after regaining the weight.  Reports she has not consistently go to the doctor.  Here for evaluation today of palpitations, sweating, nausea, lightheadedness and chest tightness that started just prior to arrival while sitting at work having a conversation.  States she suddenly felt like hot water had been dumped all over her, her coworkers gave her aspirin  and called EMS, she states by the time EMS got there she felt slightly better.  Still having intermittent squeezing sensation in her chest.  It does not radiate.  She has no pain in her back neck or jaw.  She occasionally has pain with respiration but not consistently.  Pain is located centrally in the chest.  She does have history of GERD but states this feels different.  Does not smoke, drinks alcohol occasionally and uses THC Gummies at bedtime for laxation, states she will takes a half and it is a low dose, though she is not sure of the dose.  Take 1 last night but has not had any THC today.  He had coffee this morning but did not drink any additional caffeine .  Denies any supplement use.  Patient's husband is at bedside and states she has been struggling with stress at home and work and with going through menopause at this time.  Patient does report family history of early onset CAD in her father who had his first heart attack in his 81s.    Palpitations      Prior to Admission medications   Medication Sig Start Date End Date Taking? Authorizing Provider  lansoprazole  (PREVACID ) 30 MG capsule Take one capsule once to twice daily before a meal for reflux 03/30/23   Ezzard Sonny RAMAN, PA-C   UNABLE TO FIND Med Name: Vienva 0.1mg  daily    [provider]    Allergies: Patient has no known allergies.    Review of Systems  Cardiovascular:  Positive for palpitations.    Updated Vital Signs BP (!) 148/97 (BP Location: Right Arm)   Pulse 77   Temp 97.7 F (36.5 C) (Oral)   Resp 18   Ht 5' 7 (1.702 m)   Wt 90.7 kg   LMP  (LMP Unknown)   SpO2 97%   BMI 31.32 kg/m   Physical Exam Vitals and nursing note reviewed.  Constitutional:      General: She is not in acute distress.    Appearance: She is well-developed.  HENT:     Head: Normocephalic and atraumatic.     Mouth/Throat:     Mouth: Mucous membranes are moist.  Eyes:     Extraocular Movements: Extraocular movements intact.     Conjunctiva/sclera: Conjunctivae normal.     Pupils: Pupils are equal, round, and reactive to light.  Cardiovascular:     Rate and Rhythm: Normal rate and regular rhythm.     Pulses:          Radial pulses are 2+ on the right side and 2+ on the left side.       Dorsalis pedis pulses are 2+ on the right side and 2+ on the left side.  Posterior tibial pulses are 2+ on the right side and 2+ on the left side.     Heart sounds: Normal heart sounds. No murmur heard. Pulmonary:     Effort: Pulmonary effort is normal. No respiratory distress.     Breath sounds: Normal breath sounds.  Abdominal:     Palpations: Abdomen is soft.     Tenderness: There is no abdominal tenderness.  Musculoskeletal:        General: No swelling.     Cervical back: Neck supple.     Right lower leg: No edema.     Left lower leg: No edema.  Skin:    General: Skin is warm and dry.     Capillary Refill: Capillary refill takes less than 2 seconds.  Neurological:     General: No focal deficit present.     Mental Status: She is alert and oriented to person, place, and time.  Psychiatric:        Mood and Affect: Mood normal.     (all labs ordered are listed, but only abnormal results are  displayed) Labs Reviewed  BASIC METABOLIC PANEL WITH GFR  CBC  D-DIMER, QUANTITATIVE  CBG MONITORING, ED  TROPONIN I (HIGH SENSITIVITY)    EKG: EKG Interpretation Date/Time:  Thursday May 09 2024 10:25:08 EDT Ventricular Rate:  79 PR Interval:  135 QRS Duration:  79 QT Interval:  361 QTC Calculation: 414 R Axis:   62  Text Interpretation: Sinus rhythm Low voltage, precordial leads No significant change since prior 5/23 Confirmed by Towana Sharper [phone]) on 05/09/2024 10:43:30 AM  Radiology: [facility] Chest Port 1 View Result Date: 05/09/2024 CLINICAL DATA:  Chest tightness, palpitations, nausea, diaphoresis and flushing. EXAM: PORTABLE CHEST 1 VIEW COMPARISON:  10/15/2017 FINDINGS: Poor inspiration. Normal sized heart. Clear lungs with normal vascularity. Unremarkable bones. IMPRESSION: No active disease. Electronically Signed   By: Elspeth Bathe M.D.   On: 05/09/2024 10:42     Procedures   Medications Ordered in the ED - No data to display                                  Medical Decision Making This patient presents to the ED for concern of  palpitations nausea, sweating and chest pressure that started just prior to arrival lasting about 10 minutes but still having some intermittent pressure, and intermittent pain with breathing this involves an extensive number of treatment options, and is a complaint that carries with it a high risk of complications and morbidity.  The differential diagnosis includes ACS, PE, pneumonia, gastritis, esophageal spasm, dissection, costochondritis, arrhythmia,   Co morbidities that complicate the patient evaluation :   Hypertension-not on medications   Additional history obtained:  Additional history obtained from EMR External records from outside source obtained and reviewed including prior notes and labs   Lab Tests:  I Ordered, and personally interpreted labs.  The pertinent results include: Troponin negative x 2, D-dimer negative,  CBC and BMP normal   Imaging Studies ordered:  I ordered imaging studies including chest x-ray which shows no pulmonary edema or infiltrate I independently visualized and interpreted imaging within scope of identifying emergent findings  I agree with the radiologist interpretation   Cardiac Monitoring: / EKG:  The patient was maintained on a cardiac monitor.  I personally viewed and interpreted the cardiac monitored which showed an underlying rhythm of: sinus rhythm   Consultations Obtained:  I requested consultation with the cardiologist Dr. Mallipeddi,  and discussed lab and imaging findings as well as pertinent plan - they recommend: Admission by hospitalist service to [facility] for cardiac CT tomorrow   Problem List / ED Course / Critical interventions / Medication management  -Patient had chest pain this morning while at rest.  She does note she is under a lot of stress but was just sitting have a conversation with this happen.  She also felt some palpitations, got sweaty and nauseous and felt dizzy.  She was given aspirin  by coworkers and transported here via EMS.  She started to feel better but then started having some chest pain again while in the ED.  She was given 0.4 of nitroglycerin  and her pain had resolved but it gave her headache.  She was given without relief.  When she got to the bathroom and then had shortness of breath with walking which is abnormal for her.  Her chest pain had been returned but she declined another dose of nitroglycerin .  She was given fentanyl  with good relief of pain.  Despite her normal troponins since she is having ongoing pain I discussed with patient she will likely need admission.  Discussed with Dr. Bettyjo who would like her to be admitted to [facility] for cardiac CT tomorrow.  Overall she is likely low risk for ischemia but since she is having these ongoing symptoms will need further evaluation in the hospital.  Her score is 5.  EKG  nonischemic. I ordered medication as above Reevaluation of the patient after these medicines showed that the patient improved I have reviewed the patients home medicines and have made adjustments as needed      Amount and/or Complexity of Data Reviewed Labs: ordered. Radiology: ordered.  Risk OTC drugs. Prescription drug management. Decision regarding hospitalization.        Final diagnoses:  None    ED Discharge Orders     None          Suellen Sherran DELENA DEVONNA 05/09/24 1930    Towana Ozell BROCKS, MD 05/10/24 1726

## 2024-05-09 NOTE — H&P (Signed)
 [facility] H&P   Patient Demographics:    Elizabeth Krueger, is a 58 y.o. female  MRN: [id number]   DOB - [birth year]  Admit Date - 05/09/2024  Outpatient Primary MD for the patient is Renato Dorothey HERO, NP  Referring MD/NP/PA: PA Celeste  Patient coming from: home  Chief Complaint  Patient presents with   Palpitations      HPI:    Elizabeth Krueger  is a 58 y.o. female, with past medical history of hypertension, prediabetes, OSA (does not wear her CPAP she lost her CPAP machine), presents to ED secondary to complaints of chest pain, intermittent, started 1 to 2 weeks ago, happens at rest and exertion as well, she woke up with it, sudden onset, substernal, radiating to her throat, reports some diaphoresis, feeling hot, it resolved by sublingual nitro in ED, no dizziness, no syncope, no lower extremity edema, is active, walks 1 mile per day with no issues, she does have some chest tightness during household activities, reports history of hypertension, but does not take any medication as she does not follow with physician, reports she had history of prediabetes in the past. - In ED troponin is negative x 2, and her EKG nonacute, but she had resolution of her chest PT on sublingual nitro, so cardiology were consulted, requested admission to [facility] for cardiac CT chest, and 2D echo.    Review of systems:      A full 10 point Review of Systems was done, except as stated above, all other Review of Systems were negative.   With Past History of the following :    Past Medical History:  Diagnosis Date   Asthma    Facial tic    GERD (gastroesophageal reflux disease)    IBS (irritable bowel syndrome)       Past Surgical History:  Procedure Laterality Date   CESAREAN SECTION        Social History:     Social History   Tobacco Use   Smoking status: Former    Current  packs/day: 0.00    Average packs/day: 0.1 packs/day for 2.0 years (0.2 ttl pk-yrs)    Types: Pipe, Cigarettes    Start date: 08/29/1998    Quit date: 08/29/2000    Years since quitting: 23.7   Smokeless tobacco: Never  Substance Use Topics   Alcohol use: Yes    Comment: occasionally        Family History :     Family History  Problem Relation Age of Onset   Prostate cancer Father    Diabetes Father    Heart disease Father    COPD Father    Rheum arthritis Father    Diabetes Mother    Stomach cancer Maternal Grandmother    Liver cancer Paternal Grandfather    Colon cancer Neg Hx    Esophageal cancer Neg Hx  Home Medications:   Prior to Admission medications   Medication Sig Start Date End Date Taking? Authorizing Provider  lansoprazole  (PREVACID ) 30 MG capsule Take one capsule once to twice daily before a meal for reflux 03/30/23  Yes Ezzard Sonny RAMAN, PA-C  MEDIUM CHAIN TRIGLYCERIDES PO Take 2 capsules by mouth daily.   Yes [provider]  Specialty Vitamins Products (MENOPAUSE SUPPORT PO) Take 2 capsules by mouth daily.   Yes [provider]     Allergies:    No Known Allergies   Physical Exam:   Vitals  Blood pressure (!) 140/82, pulse 65, temperature 97.7 F (36.5 C), temperature source Oral, resp. rate 12, height 5' 7 (1.702 m), weight 90.7 kg, SpO2 100%.   1. General well-developed female, laying in bed, no apparent distress  2. Normal affect and insight, Not Suicidal or Homicidal, Awake Alert, Oriented X 3.  3. No F.N deficits, ALL C.Nerves Intact, Strength 5/5 all 4 extremities, Sensation intact all 4 extremities, Plantars down going.  4. Ears and Eyes appear Normal, Conjunctivae clear, PERRLA. Moist Oral Mucosa.  5. Supple Neck, No JVD, No cervical lymphadenopathy appriciated, No Carotid Bruits.  6. Symmetrical Chest wall movement, Good air movement bilaterally, CTAB.  7. RRR, No Gallops, Rubs or Murmurs, No Parasternal  Heave.  8. Positive Bowel Sounds, Abdomen Soft, No tenderness, No organomegaly appriciated,No rebound -guarding or rigidity.  9.  No Cyanosis, Normal Skin Turgor, No Skin Rash or Bruise.  10. Good muscle tone,  joints appear normal , no effusions, Normal ROM.    Data Review:    CBC Recent Labs  Lab 05/09/24 1049  WBC 6.8  HGB 14.1  HCT 42.1  PLT 227  MCV 91.7  MCH 30.7  MCHC 33.5  RDW 12.5   ------------------------------------------------------------------------------------------------------------------  Chemistries  Recent Labs  Lab 05/09/24 1049  NA 138  K 4.6  CL 106  CO2 22  GLUCOSE 98  BUN 17  CREATININE 0.87  CALCIUM  9.5   ------------------------------------------------------------------------------------------------------------------ estimated creatinine clearance is 82.4 mL/min (by C-G formula based on SCr of 0.87 mg/dL). ------------------------------------------------------------------------------------------------------------------ No results for input(s): TSH, T4TOTAL, T3FREE, THYROIDAB in the last 72 hours.  Invalid input(s): FREET3  Coagulation profile No results for input(s): INR, PROTIME in the last 168 hours. ------------------------------------------------------------------------------------------------------------------- Recent Labs    05/09/24 1054  DDIMER 0.35   -------------------------------------------------------------------------------------------------------------------  Cardiac Enzymes No results for input(s): CKMB, TROPONINI, MYOGLOBIN in the last 168 hours.  Invalid input(s): CK ------------------------------------------------------------------------------------------------------------------ No results found for: BNP   ---------------------------------------------------------------------------------------------------------------  Urinalysis    Component Value Date/Time   COLORURINE AMBER (A)  01/05/2022 0306   APPEARANCEUR HAZY (A) 01/05/2022 0306   LABSPEC 1.030 01/05/2022 0306   PHURINE 5.0 01/05/2022 0306   GLUCOSEU NEGATIVE 01/05/2022 0306   HGBUR NEGATIVE 01/05/2022 0306   BILIRUBINUR NEGATIVE 01/05/2022 0306   KETONESUR 20 (A) 01/05/2022 0306   PROTEINUR 100 (A) 01/05/2022 0306   UROBILINOGEN 1.0 07/24/2012 0229   NITRITE NEGATIVE 01/05/2022 0306   LEUKOCYTESUR TRACE (A) 01/05/2022 0306    ----------------------------------------------------------------------------------------------------------------   Imaging Results:    DG Chest Port 1 View Result Date: 05/09/2024 CLINICAL DATA:  Chest tightness, palpitations, nausea, diaphoresis and flushing. EXAM: PORTABLE CHEST 1 VIEW COMPARISON:  10/15/2017 FINDINGS: Poor inspiration. Normal sized heart. Clear lungs with normal vascularity. Unremarkable bones. IMPRESSION: No active disease. Electronically Signed   By: Elspeth Bathe M.D.   On: 05/09/2024 10:42     EKG:  Vent. rate 79 BPM PR interval 135  ms QRS duration 79 ms QT/QTcB 361/414 ms P-R-T axes 24 62 55 Sinus rhythm Low voltage, precordial leads No significant change since prior 5/23   Assessment & Plan:    Principal Problem:   Chest pain Active Problems:   Gastroesophageal reflux disease without esophagitis   Essential hypertension    Chest pain -Cardiology input greatly appreciated - Appears mixed in nature. - She is currently chest pain-free, resolved with sublingual nitro - Appointments negative x 2, EKG nonacute. - Commendation for 2D echo and CTA coronaries, so will be transferred to [facility] for that - Start daily aspirin  pending further workup  Hypertension, poorly controlled - Started on amlodipine   OSA - Does not wear CPAP  GERD - Continue with PPI  Obesity class I Body mass index is 31.32 kg/m.    DVT Prophylaxis Heparin   AM Labs Ordered, also please review Full Orders  Family Communication: Admission, patients condition  and plan of care including tests being ordered have been discussed with the patient and husband who indicate understanding and agree with the plan and Code Status.  Code Status full code  Likely DC to home  Consults called: Cardiology  Admission status: Observation  Time spent in minutes : 60 minutes   Brayton Lye M.D on 05/09/2024 at 4:34 PM   [facility] - Office  [phone]

## 2024-05-09 NOTE — Consult Note (Signed)
 CARDIOLOGY CONSULT NOTE    Patient ID: Elizabeth Krueger; [id number]; Aug 13, 1966   Admit date: 05/09/2024 Date of Consult: 05/09/2024  Primary Care Provider: Renato Dorothey HERO, NP Primary Cardiologist:  Primary Electrophysiologist:    History of Present Illness:   [name] is a 58 year old F known to have HTN, prediabetes presented to the ER with chest pain.  Patient reported having chest pain that started around 1 to 2 weeks ago that was less frequent, happening at rest/exertion and this morning, after she woke up, she started to have sudden onset of substernal chest tightness radiating to her throat and feeling hot.  It has been intermittent nature since then.  Resolved by SL NTG given in the ER.  But it gave her headache.  Chest pain recurred again.  During the interview, she has on and off chest tightness.  No other symptoms of DOE, dizziness, syncope, leg swelling.  She walks for 1 mile daily with no issues but has chest tightness sometimes with household activities.  Does not usually go to a doctor.  She was told she was prediabetic in the past.  She is also going through a lot of stress at work.  Past Medical History:  Diagnosis Date   Asthma    Facial tic    GERD (gastroesophageal reflux disease)    IBS (irritable bowel syndrome)     Past Surgical History:  Procedure Laterality Date   CESAREAN SECTION         Inpatient Medications: Scheduled Meds:  Continuous Infusions:  PRN Meds:   Allergies:   No Known Allergies  Social History:   Social History   Socioeconomic History   Marital status: Married    Spouse name: Not on file   Number of children: Not on file   Years of education: Not on file   Highest education level: Not on file  Occupational History   Not on file  Tobacco Use   Smoking status: Former    Current packs/day: 0.00    Average packs/day: 0.1 packs/day for 2.0 years (0.2 ttl pk-yrs)    Types: Pipe, Cigarettes    Start date: 08/29/1998     Quit date: 08/29/2000    Years since quitting: 23.7   Smokeless tobacco: Never  Vaping Use   Vaping status: Never Used  Substance and Sexual Activity   Alcohol use: Yes    Comment: occasionally   Drug use: No   Sexual activity: Yes  Other Topics Concern   Not on file  Social History Narrative   Married, lives with spouse   1 child   3 cans of caffeine  daily   Research scientist (medical)   Social Drivers of Health   Financial Resource Strain: Not on file  Food Insecurity: Not on file  Transportation Needs: Not on file  Physical Activity: Not on file  Stress: Not on file  Social Connections: Not on file  Intimate Partner Violence: Not on file    Family History:    Family History  Problem Relation Age of Onset   Prostate cancer Father    Diabetes Father    Heart disease Father    COPD Father    Rheum arthritis Father    Diabetes Mother    Stomach cancer Maternal Grandmother    Liver cancer Paternal Grandfather    Colon cancer Neg Hx    Esophageal cancer Neg Hx      ROS:  Please see the history of present illness.  ROS  All other ROS reviewed and negative.     Physical Exam/Data:   Vitals:   05/09/24 1230 05/09/24 1330 05/09/24 1345 05/09/24 1400  BP: 129/74 122/76 135/81 (!) 140/82  Pulse: 60 64 66 65  Resp: 12 12 18 12   Temp:      TempSrc:      SpO2: 100% 98% 96% 100%  Weight:      Height:       No intake or output data in the 24 hours ending 05/09/24 1620 Filed Weights   05/09/24 1022  Weight: 90.7 kg   Body mass index is 31.32 kg/m.  General:  Well nourished, well developed, in no acute distress HEENT: normal Lymph: no adenopathy Neck: no JVD Endocrine:  No thryomegaly Vascular: No carotid bruits; FA pulses 2+ bilaterally without bruits  Cardiac:  normal S1, S2; RRR; no murmur, chest pain is reproducible on palpation Lungs:  clear to auscultation bilaterally, no wheezing, rhonchi or rales  Abd: soft, nontender, no hepatomegaly  Ext: no  edema Musculoskeletal:  No deformities, BUE and BLE strength normal and equal Skin: warm and dry  Neuro:  CNs 2-12 intact, no focal abnormalities noted Psych:  Normal affect   Laboratory Data:  Chemistry Recent Labs  Lab 05/09/24 1049  NA 138  K 4.6  CL 106  CO2 22  GLUCOSE 98  BUN 17  CREATININE 0.87  CALCIUM  9.5  GFRNONAA >60  ANIONGAP 10    No results for input(s): PROT, ALBUMIN, AST, ALT, ALKPHOS, BILITOT in the last 168 hours. Hematology Recent Labs  Lab 05/09/24 1049  WBC 6.8  RBC 4.59  HGB 14.1  HCT 42.1  MCV 91.7  MCH 30.7  MCHC 33.5  RDW 12.5  PLT 227   Cardiac EnzymesNo results for input(s): TROPONINI in the last 168 hours. No results for input(s): TROPIPOC in the last 168 hours.  BNPNo results for input(s): BNP, PROBNP in the last 168 hours.  DDimer  Recent Labs  Lab 05/09/24 1054  DDIMER 0.35    Radiology/Studies:  DG Chest Port 1 View Result Date: 05/09/2024 CLINICAL DATA:  Chest tightness, palpitations, nausea, diaphoresis and flushing. EXAM: PORTABLE CHEST 1 VIEW COMPARISON:  10/15/2017 FINDINGS: Poor inspiration. Normal sized heart. Clear lungs with normal vascularity. Unremarkable bones. IMPRESSION: No active disease. Electronically Signed   By: Elspeth Bathe M.D.   On: 05/09/2024 10:42    Assessment and Plan:   Chest pain - Mixed in nature - Going through a lot of stress at work now and she pushed heavy furniture 1 week ago. - Chest pain is reproducible on palpation and also resolved by SL NTG. - EKG showed NSR, no ischemia. Hs troponins within normal limits, <2, <2 - Low risk for CAD.  Obtain echocardiogram and CT cardiac.  HTN, poorly controlled - Start amlodipine  5 mg once daily.   40 minutes spent in reviewing the labs, vitals, discussing the above problems with the patient and her husband at the bedside and documentation.   For questions or updates, please contact [facility] Please consult [email or web address]  for contact info under Cardiology/STEMI.   Signed, @ME1 @ 05/09/2024 4:20 PM

## 2024-05-09 NOTE — ED Notes (Signed)
[facility] called for transport at this time. 

## 2024-05-09 NOTE — ED Triage Notes (Signed)
 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 Patient was nauseated, flushed, sweating when this happened. The palpitations come and go. Pt does have some complaints of chest tightness at times.

## 2024-05-10 ENCOUNTER — Telehealth: Payer: Self-pay | Admitting: Internal Medicine

## 2024-05-10 ENCOUNTER — Observation Stay (HOSPITAL_BASED_OUTPATIENT_CLINIC_OR_DEPARTMENT_OTHER)
Admit: 2024-05-10 | Discharge: 2024-05-10 | Disposition: A | Discharge status: Home / Self Care | Attending: Internal Medicine | Admitting: Internal Medicine

## 2024-05-10 ENCOUNTER — Observation Stay (HOSPITAL_COMMUNITY)

## 2024-05-10 DIAGNOSIS — E785 Hyperlipidemia, unspecified: Secondary | ICD-10-CM | POA: Diagnosis not present

## 2024-05-10 DIAGNOSIS — R079 Chest pain, unspecified: Secondary | ICD-10-CM | POA: Diagnosis not present

## 2024-05-10 DIAGNOSIS — I10 Essential (primary) hypertension: Secondary | ICD-10-CM | POA: Diagnosis not present

## 2024-05-10 DIAGNOSIS — I2510 Atherosclerotic heart disease of native coronary artery without angina pectoris: Secondary | ICD-10-CM | POA: Diagnosis not present

## 2024-05-10 DIAGNOSIS — Z13220 Encounter for screening for lipoid disorders: Secondary | ICD-10-CM

## 2024-05-10 DIAGNOSIS — G4733 Obstructive sleep apnea (adult) (pediatric): Secondary | ICD-10-CM | POA: Diagnosis not present

## 2024-05-10 DIAGNOSIS — K219 Gastro-esophageal reflux disease without esophagitis: Secondary | ICD-10-CM | POA: Diagnosis not present

## 2024-05-10 DIAGNOSIS — Z131 Encounter for screening for diabetes mellitus: Secondary | ICD-10-CM

## 2024-05-10 DIAGNOSIS — Z79899 Other long term (current) drug therapy: Secondary | ICD-10-CM

## 2024-05-10 LAB — LIPID PANEL
Cholesterol: 165 mg/dL (ref 0–200)
HDL: 40 mg/dL — ABNORMAL LOW (ref >40)
LDL Cholesterol: 94 mg/dL (ref 0–99)
Total CHOL/HDL Ratio: 4.1 ratio
Triglycerides: 155 mg/dL — ABNORMAL HIGH (ref <150)
VLDL: 31 mg/dL (ref 0–40)

## 2024-05-10 LAB — BASIC METABOLIC PANEL WITH GFR
Anion gap: 9 (ref 5–15)
BUN: 16 mg/dL (ref 6–20)
CO2: 25 mmol/L (ref 22–32)
Calcium: 8.7 mg/dL — ABNORMAL LOW (ref 8.9–10.3)
Chloride: 105 mmol/L (ref 98–111)
Creatinine, Ser: 0.86 mg/dL (ref 0.44–1.00)
GFR, Estimated: >60 mL/min (ref >60)
Glucose, Bld: 109 mg/dL — ABNORMAL HIGH (ref 70–99)
Potassium: 4 mmol/L (ref 3.5–5.1)
Sodium: 139 mmol/L (ref 135–145)

## 2024-05-10 LAB — CBC
HCT: 39.7 % (ref 36.0–46.0)
Hemoglobin: 13.3 g/dL (ref 12.0–15.0)
MCH: 30.6 pg (ref 26.0–34.0)
MCHC: 33.5 g/dL (ref 30.0–36.0)
MCV: 91.3 fL (ref 80.0–100.0)
Platelets: 225 K/uL (ref 150–400)
RBC: 4.35 MIL/uL (ref 3.87–5.11)
RDW: 12.5 % (ref 11.5–15.5)
WBC: 8.5 K/uL (ref 4.0–10.5)
nRBC: 0 % (ref 0.0–0.2)

## 2024-05-10 LAB — HIV ANTIBODY (ROUTINE TESTING W REFLEX): HIV Screen 4th Generation wRfx: NONREACTIVE

## 2024-05-10 MED ORDER — NITROGLYCERIN 0.4 MG SL SUBL
0.8000 mg | SUBLINGUAL_TABLET | Freq: Once | SUBLINGUAL | Status: AC
  Administered 2024-05-10: 0.8 mg via SUBLINGUAL

## 2024-05-10 MED ORDER — METOPROLOL TARTRATE 100 MG PO TABS
100.0000 mg | ORAL_TABLET | Freq: Once | ORAL | Status: DC
  Filled 2024-05-10: qty 1

## 2024-05-10 MED ORDER — ROSUVASTATIN CALCIUM 5 MG PO TABS: 5.0000 mg | ORAL_TABLET | Freq: Every day | ORAL | 3 refills | Status: DC

## 2024-05-10 MED ORDER — IOHEXOL 350 MG/ML SOLN
80.0000 mL | Freq: Once | INTRAVENOUS | Status: AC | PRN
  Administered 2024-05-10: 100 mL via INTRAVENOUS

## 2024-05-10 MED ORDER — LORAZEPAM 0.5 MG PO TABS
0.2500 mg | ORAL_TABLET | Freq: Once | ORAL | Status: AC
Start: 2024-05-10 — End: 2024-05-10
  Administered 2024-05-10: 0.25 mg via ORAL
  Filled 2024-05-10: qty 1

## 2024-05-10 NOTE — Telephone Encounter (Signed)
 Orders placed for [facility] and [facility].

## 2024-05-10 NOTE — TOC Transition Note (Signed)
 Transition of Care [facility]) - Discharge Note   Patient Details  Name: Elizabeth Krueger MRN: [id number] Date of Birth: July 01, 1966  [facility] CM/SW Contact:  Waddell Barnie Rama, RN Phone Number: 05/10/2024, 10:15 AM   Clinical Narrative:    For dc ,has no needs.         Patient Goals and CMS Choice            Discharge Placement                       Discharge Plan and Services Additional resources added to the After Visit Summary for                                       Social Drivers of Health (SDOH) Interventions SDOH Screenings   Tobacco Use: Medium Risk (05/09/2024)     Readmission Risk Interventions     No data to display

## 2024-05-10 NOTE — Telephone Encounter (Signed)
 Called patient    Reviewed CT scan  Mild plaquing   Ca score 7 Does not appear to be cause for CP Most likely musculoskeletal  Recomm   Rest    Lipids LDL 92  Trig 155    Limit carbs  Would add 5 mg Crestor     Follow up NMR panel and A1C and liver panel in 8 wks  Note pt has follow up with  [facility] in Oct 2025

## 2024-05-10 NOTE — Addendum Note (Signed)
 Addended by: Nyeshia Mysliwiec G on: 05/10/2024 03:55 PM   Modules accepted: Orders

## 2024-05-10 NOTE — Discharge Summary (Addendum)
 Physician Discharge Summary  Elizabeth Krueger FMW:[id number] DOB: September 16, 1965 DOA: 05/09/2024  PCP: Renato Dorothey HERO, NP  Admit date: 05/09/2024 Discharge date: 05/10/2024    Admitted From: Home Disposition: Home  Recommendations for Outpatient Follow-up:  Follow up with PCP in 1-2 weeks Please obtain BMP/CBC in one week Please follow up with your PCP on the following pending results: Unresulted Labs (From admission, onward)     Start     Ordered   05/10/24 0245  HIV Antibody (routine testing w rflx)  Once,   R        05/10/24 0245              Home Health: None Equipment/Devices: None  Discharge Condition: Stable CODE STATUS: Full code Diet recommendation:  Diet Order             Diet Heart Room service appropriate? Yes; Fluid consistency: Thin  Diet effective now                 Due to brief hospitalization, I have copied admitting hospitalist HPI as below.   Elizabeth Krueger  is a 58 y.o. female, with past medical history of hypertension, prediabetes, OSA (does not wear her CPAP she lost her CPAP machine), presents to ED secondary to complaints of chest pain, intermittent, started 1 to 2 weeks ago, happens at rest and exertion as well, she woke up with it, sudden onset, substernal, radiating to her throat, reports some diaphoresis, feeling hot, it resolved by sublingual nitro in ED, no dizziness, no syncope, no lower extremity edema, is active, walks 1 mile per day with no issues, she does have some chest tightness during household activities, reports history of hypertension, but does not take any medication as she does not follow with physician, reports she had history of prediabetes in the past. - In ED troponin is negative x 2, and her EKG nonacute, but she had resolution of her chest PT on sublingual nitro, so cardiology were consulted, requested admission to [facility] for cardiac CT chest, and 2D echo.    Subjective: Seen and examined, husband at the  bedside.  Patient was complaining of chest pressure along with intermittent shortness of breath.  No palpitation, or any other complaint.  Brief/Interim Summary: Patient was admitted at [facility] hospital for chest pain, transferred to [facility] per cardiology recommendation for coronary CT scan.  Patient was seen by Dr. Ross/cardiologist today.  Unfortunately, CT coronary machine is down at [facility].  Dr. Okey recommended discharging patient and she will arrange outpatient CT coronary today across the hospital in outpatient setting and based on the results, she will provide further recommendations to the patient.  Patient is going to be discharged per cardiology recommendations.  No changes in medications.  Troponins negative.  Cardiology suspects her chest pain as atypical.  Cardiology has personally discussed this plan of care with the patient and her husband and they verbalized understanding.  Of note, she also has point tenderness at the anterior chest on my examination.  Discharge plan was discussed with patient and/or family member and they verbalized understanding and agreed with it.  Discharge Diagnoses:  Principal Problem:   Chest pain Active Problems:   Gastroesophageal reflux disease without esophagitis   Essential hypertension    Discharge Instructions   Allergies as of 05/10/2024   No Known Allergies      Medication List     TAKE these medications    lansoprazole  30 MG capsule  Commonly known as: PREVACID  Take one capsule once to twice daily before a meal for reflux   MEDIUM CHAIN TRIGLYCERIDES PO Take 2 capsules by mouth daily.   MENOPAUSE SUPPORT PO Take 2 capsules by mouth daily.        Follow-up Information     Renato Dorothey HERO, NP Follow up in 1 week(s).   Specialty: Internal Medicine Contact information: [id number] [place] [address] [phone]                No Known Allergies  Consultations:  Cardiology   Procedures/Studies: [facility] Chest Port 1 View Result Date: 05/09/2024 CLINICAL DATA:  Chest tightness, palpitations, nausea, diaphoresis and flushing. EXAM: PORTABLE CHEST 1 VIEW COMPARISON:  10/15/2017 FINDINGS: Poor inspiration. Normal sized heart. Clear lungs with normal vascularity. Unremarkable bones. IMPRESSION: No active disease. Electronically Signed   By: Elspeth Bathe M.D.   On: 05/09/2024 10:42     Discharge Exam: Vitals:   05/10/24 0713 05/10/24 0900  BP: 111/77 122/81  Pulse: 73 78  Resp: 16 18  Temp: 97.9 F (36.6 C)   SpO2: 96%    Vitals:   05/09/24 2245 05/10/24 0300 05/10/24 0713 05/10/24 0900  BP: 117/66 117/74 111/77 122/81  Pulse: 81 71 73 78  Resp: 14 16 16 18   Temp: 97.9 F (36.6 C) 98 F (36.7 C) 97.9 F (36.6 C)   TempSrc: Oral Oral Oral   SpO2: 97% 96% 96%   Weight:      Height:        General: Pt is alert, awake, not in acute distress Cardiovascular: RRR, S1/S2 +, no rubs, no gallops Respiratory: CTA bilaterally, no wheezing, no rhonchi Abdominal: Soft, NT, ND, bowel sounds + Extremities: no edema, no cyanosis    The results of significant diagnostics from this hospitalization (including imaging, microbiology, ancillary and laboratory) are listed below for reference.     Microbiology: No results found for this or any previous visit (from the past 240 hours).   Labs: BNP (last 3 results) No results for input(s): BNP in the last 8760 hours. Basic Metabolic Panel: Recent Labs  Lab 05/09/24 1049 05/10/24 0245  NA 138 139  K 4.6 4.0  CL 106 105  CO2 22 25  GLUCOSE 98 109*  BUN 17 16  CREATININE 0.87 0.86  CALCIUM  9.5 8.7*   Liver Function Tests: No results for input(s): AST, ALT, ALKPHOS, BILITOT, PROT, ALBUMIN in the last 168 hours. No results for input(s): LIPASE, AMYLASE in the last 168 hours. No results for input(s): AMMONIA in the last 168 hours. CBC: Recent Labs  Lab 05/09/24 1049  05/10/24 0245  WBC 6.8 8.5  HGB 14.1 13.3  HCT 42.1 39.7  MCV 91.7 91.3  PLT 227 225   Cardiac Enzymes: No results for input(s): CKTOTAL, CKMB, CKMBINDEX, TROPONINI in the last 168 hours. BNP: Invalid input(s): POCBNP CBG: Recent Labs  Lab 05/09/24 1025  GLUCAP 97   D-Dimer Recent Labs    05/09/24 1054  DDIMER 0.35   Hgb A1c No results for input(s): HGBA1C in the last 72 hours. Lipid Profile Recent Labs    05/10/24 0245  CHOL 165  HDL 40*  LDLCALC 94  TRIG 844*  CHOLHDL 4.1   Thyroid  function studies No results for input(s): TSH, T4TOTAL, T3FREE, THYROIDAB in the last 72 hours.  Invalid input(s): FREET3 Anemia work up No results for input(s): VITAMINB12, FOLATE, FERRITIN, TIBC, IRON, RETICCTPCT in the last 72 hours. Urinalysis  Component Value Date/Time   COLORURINE AMBER (A) 01/05/2022 0306   APPEARANCEUR HAZY (A) 01/05/2022 0306   LABSPEC 1.030 01/05/2022 0306   PHURINE 5.0 01/05/2022 0306   GLUCOSEU NEGATIVE 01/05/2022 0306   HGBUR NEGATIVE 01/05/2022 0306   BILIRUBINUR NEGATIVE 01/05/2022 0306   KETONESUR 20 (A) 01/05/2022 0306   PROTEINUR 100 (A) 01/05/2022 0306   UROBILINOGEN 1.0 07/24/2012 0229   NITRITE NEGATIVE 01/05/2022 0306   LEUKOCYTESUR TRACE (A) 01/05/2022 0306   Sepsis Labs Recent Labs  Lab 05/09/24 1049 05/10/24 0245  WBC 6.8 8.5   Microbiology No results found for this or any previous visit (from the past 240 hours).  FURTHER DISCHARGE INSTRUCTIONS:   Get Medicines reviewed and adjusted: Please take all your medications with you for your next visit with your Primary MD   Laboratory/radiological data: Please request your Primary MD to go over all hospital tests and procedure/radiological results at the follow up, please ask your Primary MD to get all Hospital records sent to his/her office.   In some cases, they will be blood work, cultures and biopsy results pending at the time of your  discharge. Please request that your primary care M.D. goes through all the records of your hospital data and follows up on these results.   Also Note the following: If you experience worsening of your admission symptoms, develop shortness of breath, life threatening emergency, suicidal or homicidal thoughts you must seek medical attention immediately by calling 911 or calling your MD immediately  if symptoms less severe.   You must read complete instructions/literature along with all the possible adverse reactions/side effects for all the Medicines you take and that have been prescribed to you. Take any new Medicines after you have completely understood and accpet all the possible adverse reactions/side effects.    patient was instructed, not to drive, operate heavy machinery, perform activities at heights, swimming or participation in water activities or provide baby-sitting services while on Pain, Sleep and Anxiety Medications; until their outpatient Physician has advised to do so again. Also recommended to not to take more than prescribed Pain, Sleep and Anxiety Medications.  It is not advisable to combine anxiety, sleep and pain medications without talking with your primary care provider.     Wear Seat belts while driving.   Please note: You were cared for by a hospitalist during your hospital stay. Once you are discharged, your primary care physician will handle any further medical issues. Please note that NO REFILLS for any discharge medications will be authorized once you are discharged, as it is imperative that you return to your primary care physician (or establish a relationship with a primary care physician if you do not have one) for your post hospital discharge needs so that they can reassess your need for medications and monitor your lab values  Time coordinating discharge: Over 30 minutes  SIGNED:   Fredia Skeeter, MD  [facility] 05/10/2024, 10:18 AM *Please note that this  is a verbal dictation therefore any spelling or grammatical errors are due to the Dragon Medical One system interpretation. If 7PM-7AM, please contact night-coverage [email or web address]

## 2024-05-10 NOTE — Progress Notes (Signed)
 Rounding Note   Patient Name: Elizabeth Krueger Date of Encounter: 05/10/2024  [facility] Cardiologist: Mallipeddi (NEW)  Subjective  Mild chest pressure   Scheduled Meds:  amLODipine   5 mg Oral Daily   aspirin  EC  81 mg Oral Daily   heparin   5,000 Units Subcutaneous Q8H   Continuous Infusions:  PRN Meds: acetaminophen , nitroGLYCERIN , ondansetron  (ZOFRAN ) IV   Vital Signs  Vitals:   05/09/24 1900 05/09/24 2217 05/09/24 2245 05/10/24 0300  BP: 123/74  117/66 117/74  Pulse: 73  81 71  Resp: 14  14 16   Temp:   97.9 F (36.6 C) 98 F (36.7 C)  TempSrc:   Oral Oral  SpO2: 99%  97% 96%  Weight:  94.4 kg    Height:       No intake or output data in the 24 hours ending 05/10/24 0630    05/09/2024   10:17 PM 05/09/2024   10:22 AM 09/16/2022    7:57 AM  Last 3 Weights  Weight (lbs) 208 lb 1.8 oz 200 lb 178 lb 9.6 oz  Weight (kg) 94.4 kg 90.719 kg 81.012 kg      Telemetry SR  - Personally Reviewed  ECG  Mp new  - Personally Reviewed  Physical Exam  GEN: No acute distress.   Neck: No JVD Cardiac: RRR, no murmurs, Chest   Tender to palpation Respiratory: Clear to auscultation GI: Soft, nontender, non-distended  Ext  No edema    Labs High Sensitivity Troponin:   Recent Labs  Lab 05/09/24 1049 05/09/24 1246  TROPONINIHS <2 <2     Chemistry Recent Labs  Lab 05/09/24 1049  NA 138  K 4.6  CL 106  CO2 22  GLUCOSE 98  BUN 17  CREATININE 0.87  CALCIUM  9.5  GFRNONAA >60  ANIONGAP 10    Lipids No results for input(s): CHOL, TRIG, HDL, LABVLDL, LDLCALC, CHOLHDL in the last 168 hours.  Hematology Recent Labs  Lab 05/09/24 1049  WBC 6.8  RBC 4.59  HGB 14.1  HCT 42.1  MCV 91.7  MCH 30.7  MCHC 33.5  RDW 12.5  PLT 227   Thyroid  No results for input(s): TSH, FREET4 in the last 168 hours.  BNPNo results for input(s): BNP, PROBNP in the last 168 hours.  DDimer  Recent Labs  Lab 05/09/24 1054  DDIMER 0.35      Radiology  DG Chest Port 1 View Result Date: 05/09/2024 CLINICAL DATA:  Chest tightness, palpitations, nausea, diaphoresis and flushing. EXAM: PORTABLE CHEST 1 VIEW COMPARISON:  10/15/2017 FINDINGS: Poor inspiration. Normal sized heart. Clear lungs with normal vascularity. Unremarkable bones. IMPRESSION: No active disease. Electronically Signed   By: Elspeth Bathe M.D.   On: 05/09/2024 10:42    Cardiac Studies CT and echo ordered   Patient Profile   58 y.o. female presented to [facility] with CP    Assessment  1  Chest pain  CP has some atypical features, some clearly noncardiac   Chest wall pain CCTA has been ordered  I have reviewed with pt and husband   The CT scanner at office (across street) will provide best imaging of her coronary arteries    I would recomm this   Plan for today  D/C   Scan there with immediate read   2  Hx HTN    BP is well controlled today   Started amlodipine  yesterday PM  Will resume after CT  3   LIpids in 2024 LDL 172  Goals to be defined with CT scan     WIll discuss after      OK to d/c before CCTA Signed, Vina Gull, MD  05/10/2024, 6:30 AM

## 2024-05-10 NOTE — TOC CM/SW Note (Signed)
 Transition of Care [facility]) - Inpatient Brief Assessment   Patient Details  Name: Elizabeth Krueger MRN: [id number] Date of Birth: 12-17-1965  Transition of Care [facility]) CM/SW Contact:    Waddell Barnie Rama, RN Phone Number: 05/10/2024, 10:14 AM   Clinical Narrative: From home with spouse, has PCP and insurance on file, states has no HH services in place at this time or DME at home.  States family member will transport them home at [employer] and family is support system, states gets medications from [facility].  Pta self ambulatory.   There are no ICM (Inpatient Case Management) needs identified  at this time.  Please place consult for any ICM (Inpatient Case Management) needs.     Transition of Care Asessment: Insurance and Status: Insurance coverage has been reviewed Patient has primary care physician: Yes Home environment has been reviewed: live with spouse and mother Prior level of function:: indep Prior/Current Home Services: No current home services Social Drivers of Health Review: SDOH reviewed no interventions necessary Readmission risk has been reviewed: Yes Transition of care needs: no transition of care needs at this time

## 2024-05-13 ENCOUNTER — Ambulatory Visit: Payer: Self-pay | Admitting: Internal Medicine

## 2024-05-27 LAB — NMR, LIPOPROFILE
Cholesterol, Total: 142 mg/dL (ref 100–199)
HDL Particle Number: 34.4 umol/L (ref >=30.5)
HDL-C: 58 mg/dL (ref >39)
LDL Particle Number: 937 nmol/L (ref <1000)
LDL Size: 20.6 nm (ref >20.5)
LDL-C (NIH Calc): 71 mg/dL (ref 0–99)
LP-IR Score: 41 (ref <=45)
Small LDL Particle Number: 461 nmol/L (ref <=527)
Triglycerides: 61 mg/dL (ref 0–149)

## 2024-05-27 LAB — HEMOGLOBIN A1C
Est. average glucose Bld gHb Est-mCnc: 131 mg/dL
Hgb A1c MFr Bld: 6.2 % — ABNORMAL HIGH (ref 4.8–5.6)

## 2024-05-27 LAB — HEPATIC FUNCTION PANEL
ALT: 36 IU/L — ABNORMAL HIGH (ref 0–32)
AST: 23 IU/L (ref 0–40)
Albumin: 4.1 g/dL (ref 3.8–4.9)
Alkaline Phosphatase: 96 IU/L (ref 49–135)
Bilirubin Total: 0.6 mg/dL (ref 0.0–1.2)
Bilirubin, Direct: 0.2 mg/dL (ref 0.00–0.40)
Total Protein: 6.7 g/dL (ref 6.0–8.5)

## 2024-05-28 ENCOUNTER — Ambulatory Visit: Payer: Self-pay | Admitting: Internal Medicine

## 2024-06-03 NOTE — Progress Notes (Unsigned)
 Cardiology Office Note    Date:  06/05/2024  ID:  Elizabeth Krueger, DOB 01-Sep-1965, MRN [id number] Cardiologist: Vishnu P Mallipeddi, MD Cardiology APP:  Johnson Laymon HERO, PA-C { :  History of Present Illness:    Elizabeth Krueger is a 58 y.o. female with past medical history of HTN and prediabetes who presents to the office today for hospital follow-up.  She was most recently admitted to [facility] on 05/09/2020 and reported episodes of chest pain over the past few weeks which could occur at rest or with activity. She also reported being under increased stress. Her EKG showed no acute ST changes and troponin values were negative. 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 Her LDL was elevated at 94 and she was started on Crestor  5 mg daily. Was recommended to check an NMR panel and Hgb A1c in 8 weeks.  She actually had her follow-up labs early on 05/24/2024 and Hgb A1c was at 6.2 and her LDL had already declined to 71.  In talking with the patient today, she denies any recurrent episodes of chest pain since her recent hospitalization. She has been trying to walk for a mile a day and reports having allergies during this timeframe but no persistent symptoms. No orthopnea, PND or pitting edema. She does report having occasional palpitations which typically occur at night and last for a few seconds and resolve. She does have a smart watch and this has told her that she has atrial fibrillation at times but she has not performed an EKG by review of her phone. We reviewed how to obtain an EKG on her smart watch today. She does consume caffeine  throughout the day including coffee and Coke Zero. Only occasional alcohol use. She did stop taking Crestor  due to myalgias.   Studies Reviewed:   EKG: EKG is  not ordered today.  Coronary CTA: 04/2024 Coronary Calcium  Score:   Left main: 0   Left anterior descending artery: 5   Left circumflex artery: 1   Right coronary artery: 1   Total: 7   Percentile: 95th for age, sex, and race matched control.   Left main: The left main is a large caliber vessel with a normal take off from the left coronary cusp that bifurcates to form a left anterior descending artery and a left circumflex artery. There is no significant plaque.   Left anterior descending artery: The LAD is a large caliber vessel with one diagonal. Mild (25-49%) mixed plaque in the proximal LAD. Mild (25-49%) mixed plaque in the mid LAD.   Left circumflex artery: The LCX is non-dominant with one obtuse marginal. Minimal (1-24%) mixed plaque in the proximal LCX.   Right coronary artery: The RCA is dominant with normal take off from the right coronary cusp. The RCA terminates as a PDA and right posterolateral branch. Minimal (1-24%) mixed plaque in the mid RCA.   Other non-coronary findings:   Right Atrium: Right atrial size is within normal limits.   Right Ventricle: The right ventricular cavity is within normal limits.   Left Atrium: Left atrial size is normal in size with no left atrial appendage filling defect.   Left Ventricle: The ventricular cavity size is within normal limits.   Interatrial septum: No PFO or ASD.  Main Pulmonary Artery: Normal size of the pulmonary artery.   Pulmonary veins: Normal pulmonary venous drainage.   Systemic veins: Normal venous drainage.   Pericardium: Normal thickness without significant effusion or calcium  present.   Cardiac valves: The aortic valve is tri-leaflet with minimal calcification. The mitral valve is normal without significant calcification.   Aorta: Normal caliber with aortic atherosclerosis.   Extra-cardiac findings: See attached radiology report for non-cardiac structures.   Artifact: NA   Image  quality: Excellent   IMPRESSION: 1. Coronary calcium  score of 7. This was 95th percentile for age, sex, and race matched control.   2. Normal coronary origin with right dominance.   3. CAD-RADS 2. Mild non-obstructive CAD (25-49%). Consider non-atherosclerotic causes of chest pain. Consider preventive therapy and risk factor modification.  Physical Exam:   VS:  BP 124/82   Pulse 85   Ht 5' 7 (1.702 m)   Wt 208 lb 12.8 oz (94.7 kg)   LMP  (LMP Unknown)   SpO2 94%   BMI 32.70 kg/m    Wt Readings from Last 3 Encounters:  06/05/24 208 lb 12.8 oz (94.7 kg)  05/09/24 208 lb 1.8 oz (94.4 kg)  09/16/22 178 lb 9.6 oz (81 kg)     GEN: Well nourished, well developed female appearing in no acute distress NECK: No JVD; No carotid bruits CARDIAC: RRR, no murmurs, rubs, gallops RESPIRATORY:  Clear to auscultation without rales, wheezing or rhonchi  ABDOMEN: Appears non-distended. No obvious abdominal masses. EXTREMITIES: No clubbing or cyanosis. No pitting edema.  Distal pedal pulses are 2+ bilaterally.   Assessment and Plan:   1. Coronary artery calcification seen on CT scan - Recent Coronary CTA showed mild, nonobstructive CAD as outlined above. She denies any recurrent episodes of chest pain.  Continue with statin therapy as discussed below.  2. Hyperlipidemia LDL goal <70 - LDL had already declined to 71 after being on statin therapy for less than 2 weeks. She reports myalgias with Crestor  and did quit taking the medication. Reviewed options and she will try taking this as Crestor  5 mg once weekly. Can titrate to twice weekly dosing if able to tolerate. If unable to tolerate, I encouraged her to make us  aware and we could try Zetia 10 mg daily.  3. Prediabetes - Hgb A1c was at 6.2 on most recent check. She is planning to make dietary changes.  4. Palpitations - Will plan for a 1 week Zio patch to assess for any significant arrhythmias as her smart watch has informed her that she  has atrial fibrillation at times but there are no specific strips saved of this.  Signed, Laymon CHRISTELLA Qua, PA-C

## 2024-06-05 ENCOUNTER — Ambulatory Visit: Attending: Student | Admitting: Student

## 2024-06-05 ENCOUNTER — Encounter: Payer: Self-pay | Admitting: Student

## 2024-06-05 ENCOUNTER — Ambulatory Visit

## 2024-06-05 VITALS — BP 124/82 | HR 85 | Ht 67.0 in | Wt 208.8 lb

## 2024-06-05 DIAGNOSIS — R002 Palpitations: Secondary | ICD-10-CM

## 2024-06-05 DIAGNOSIS — I2510 Atherosclerotic heart disease of native coronary artery without angina pectoris: Secondary | ICD-10-CM

## 2024-06-05 DIAGNOSIS — E785 Hyperlipidemia, unspecified: Secondary | ICD-10-CM | POA: Diagnosis not present

## 2024-06-05 DIAGNOSIS — R7303 Prediabetes: Secondary | ICD-10-CM

## 2024-06-05 MED ORDER — ROSUVASTATIN CALCIUM 5 MG PO TABS: 5.0000 mg | ORAL_TABLET | ORAL | Status: AC

## 2024-06-05 NOTE — Patient Instructions (Signed)
 Medication Instructions:   Try Taking Crestor  5 mg one time weekly   *If you need a refill on your cardiac medications before your next appointment, please call your pharmacy*  Lab Work: NONE   If you have labs (blood work) drawn today and your tests are completely normal, you will receive your results only by: MyChart Message (if you have MyChart) OR A paper copy in the mail If you have any lab test that is abnormal or we need to change your treatment, we will call you to review the results.  Testing/Procedures: ZIO XT- Long Term Monitor Instructions   Your physician has requested you wear your ZIO patch monitor____7___days.   This is a single patch monitor.  [employer] supplies one patch monitor per enrollment.  Additional stickers are not available.   Please do not apply patch if you will be having a Nuclear Stress Test, Echocardiogram, Cardiac CT, MRI, or Chest Xray during the time frame you would be wearing the monitor. The patch cannot be worn during these tests.  You cannot remove and re-apply the ZIO XT patch monitor.       Do not shower for the first 24 hours.  You may shower after the first 24 hours.   Press button if you feel a symptom. You will hear a small click.  Record Date, Time and Symptom in the Patient Log Book.   When you are ready to remove patch, follow instructions on last 2 pages of Patient Log Book.  Stick patch monitor onto last page of Patient Log Book.   Place Patient Log Book in [place] box.  Use locking tab on box and tape box closed securely.  The [employer] and [employer] has [employer] on it.  Please place in mailbox as soon as possible.  Your physician should have your test results approximately 7 days after the monitor has been mailed back to [facility].   Call [facility] Customer Care at [phone] if you have questions regarding your ZIO XT patch monitor.  Call them immediately if you see an orange light blinking on your monitor.   If your  monitor falls off in less than 4 days contact our Monitor department at [phone].  If your monitor becomes loose or falls off after 4 days call [employer] at [phone] for suggestions on securing your monitor.    Follow-Up: At [facility], you and your health needs are our priority.  As part of our continuing mission to provide you with exceptional heart care, our providers are all part of one team.  This team includes your primary Cardiologist (physician) and Advanced Practice Providers or APPs (Physician Assistants and Nurse Practitioners) who all work together to provide you with the care you need, when you need it.  Your next appointment:   3 month(s)  Provider:   Laymon Qua, PA-C    We recommend signing up for the patient portal called MyChart.  Sign up information is provided on this After Visit Summary.  MyChart is used to connect with patients for Virtual Visits (Telemedicine).  Patients are able to view lab/test results, encounter notes, upcoming appointments, etc.  Non-urgent messages can be sent to your provider as well.   To learn more about what you can do with MyChart, go to [email or web address].   Other Instructions Thank you for choosing [facility]!

## 2024-06-19 ENCOUNTER — Ambulatory Visit: Payer: Self-pay | Admitting: Student

## 2024-06-19 DIAGNOSIS — R002 Palpitations: Secondary | ICD-10-CM

## 2024-06-19 MED ORDER — METOPROLOL TARTRATE 25 MG PO TABS: ORAL_TABLET | ORAL | 1 refills | Status: AC

## 2024-06-19 NOTE — Telephone Encounter (Signed)
 The patient has been notified of the result and verbalized understanding.  All questions (if any) were answered. Bernett Dorothyann LABOR, RN 06/19/2024 5:13 PM    Pt requests rx to be sent to [facility] to have if she needs it.

## 2024-06-19 NOTE — Telephone Encounter (Signed)
-----   Message from Laymon CHRISTELLA Qua sent at 06/19/2024  3:35 PM EDT ----- Please let the patient know that her event monitor showed predominantly normal sinus rhythm with an average heart rate of 92 bpm. She did have rare PAC's and PVC's and only 1 burst of tachycardia  (SVT) which lasted for 9 beats. Overall, no significant arrhythmias and no evidence of atrial fibrillation or flutter. If she is still having frequent palpitations, can provide with a prescription  for Lopressor  12.5 mg to take as needed for palpitations. Otherwise, she does not require medical therapy for these and would continue to try to reduce caffeine  intake. ----- Message ----- From: Okey Vina GAILS, MD Sent: 06/19/2024   1:56 PM EDT To: Laymon CHRISTELLA Qua, PA-C

## 2024-08-14 ENCOUNTER — Ambulatory Visit: Admitting: Gastroenterology

## 2024-08-16 ENCOUNTER — Ambulatory Visit: Admitting: Gastroenterology

## 2024-09-06 ENCOUNTER — Ambulatory Visit: Admitting: Gastroenterology

## 2024-09-06 ENCOUNTER — Ambulatory Visit: Admitting: Student
